# Patient Record
Sex: Female | Born: 1980 | Race: White | Hispanic: No | Marital: Single | Smoking: Current every day smoker
Health system: Southern US, Community
[De-identification: ages and names within clinical notes are randomized; demographics above are authoritative.]

## PROBLEM LIST (undated history)

## (undated) DIAGNOSIS — A6009 Herpesviral infection of other urogenital tract: Secondary | ICD-10-CM

## (undated) DIAGNOSIS — O98319 Other infections with a predominantly sexual mode of transmission complicating pregnancy, unspecified trimester: Secondary | ICD-10-CM

## (undated) HISTORY — DX: Herpesviral infection of other urogenital tract: A60.09

## (undated) HISTORY — PX: TONSILLECTOMY: SUR1361

## (undated) HISTORY — DX: Other infections with a predominantly sexual mode of transmission complicating pregnancy, unspecified trimester: O98.319

---

## 2016-03-16 NOTE — L&D Delivery Note (Signed)
Patient is a 36 y.o. now [id number] who admitted for SOL, now s/p NSVD at 7950w5d  Delivery Note At 11:03 AM a viable female was delivered via Vaginal, Spontaneous Delivery (Presentation: ;  ).  APGAR: 9, 9; weight 7 lb 9.5 oz (3445 g).   Placenta status: intact   Cord:  3-vessel  Anesthesia: Epidural Episiotomy: None Lacerations: 2nd degree; right Labial Suture Repair: 3.0 vicryl Est. Blood Loss (mL): 1000ml  Head delivered LOA. No nuchal cord present. Shoulder and body delivered in usual fashion. Infant to mother's abdomen. Cord clamped x 2 after 1-minute delay, and cut by family member. Cord blood drawn. Placenta delivered spontaneously with gentle cord traction. Fundus firm with massage and Pitocin. Perineum inspected and found to have 2nd degree laceration, which was found to be hemostatic which was repaired with 3.0 Vyrcil by Dr. Macon LargeAnyanwu with good hemostasis achieved.  Initial EBL 300ml, but later called into the room for bleeding. Dr. Macon LargeAnyanwu evaluated and patient found to have lower uterine segment atony. She was given methergine and cytotec. Total EBL 1000ml.   Mom to postpartum.  Baby to Couplet care / Skin to Skin.   Raynelle FanningJulie P. Degele, MD OB Fellow 10/16/16, 12:36 PM

## 2016-04-16 DIAGNOSIS — Z3480 Encounter for supervision of other normal pregnancy, unspecified trimester: Secondary | ICD-10-CM | POA: Insufficient documentation

## 2016-04-17 ENCOUNTER — Ambulatory Visit (INDEPENDENT_AMBULATORY_CARE_PROVIDER_SITE_OTHER): Payer: Medicaid Other | Admitting: Advanced Practice Midwife

## 2016-04-17 ENCOUNTER — Encounter: Payer: Self-pay | Admitting: Advanced Practice Midwife

## 2016-04-17 ENCOUNTER — Other Ambulatory Visit (HOSPITAL_COMMUNITY)
Admission: RE | Admit: 2016-04-17 | Discharge: 2016-04-17 | Disposition: A | Discharge status: Home / Self Care | Payer: Medicaid Other | Source: Ambulatory Visit | Attending: Advanced Practice Midwife | Admitting: Advanced Practice Midwife

## 2016-04-17 VITALS — BP 100/60 | HR 79 | Temp 98.7°F | Ht 64.0 in | Wt 125.8 lb

## 2016-04-17 DIAGNOSIS — O98319 Other infections with a predominantly sexual mode of transmission complicating pregnancy, unspecified trimester: Secondary | ICD-10-CM

## 2016-04-17 DIAGNOSIS — R894 Abnormal immunological findings in specimens from other organs, systems and tissues: Secondary | ICD-10-CM | POA: Insufficient documentation

## 2016-04-17 DIAGNOSIS — Z3490 Encounter for supervision of normal pregnancy, unspecified, unspecified trimester: Secondary | ICD-10-CM

## 2016-04-17 DIAGNOSIS — O98311 Other infections with a predominantly sexual mode of transmission complicating pregnancy, first trimester: Secondary | ICD-10-CM

## 2016-04-17 DIAGNOSIS — O26891 Other specified pregnancy related conditions, first trimester: Secondary | ICD-10-CM

## 2016-04-17 DIAGNOSIS — Z3A12 12 weeks gestation of pregnancy: Secondary | ICD-10-CM | POA: Insufficient documentation

## 2016-04-17 DIAGNOSIS — N898 Other specified noninflammatory disorders of vagina: Secondary | ICD-10-CM | POA: Diagnosis not present

## 2016-04-17 DIAGNOSIS — A6009 Herpesviral infection of other urogenital tract: Secondary | ICD-10-CM

## 2016-04-17 DIAGNOSIS — A609 Anogenital herpesviral infection, unspecified: Secondary | ICD-10-CM

## 2016-04-17 DIAGNOSIS — Z8619 Personal history of other infectious and parasitic diseases: Secondary | ICD-10-CM

## 2016-04-17 MED ORDER — VALACYCLOVIR HCL 500 MG PO TABS: 500.0000 mg | ORAL_TABLET | Freq: Two times a day (BID) | ORAL | 3 refills | Status: DC

## 2016-04-17 NOTE — Patient Instructions (Addendum)
Prenatal Care WHAT IS PRENATAL CARE? Prenatal care is the process of caring for a pregnant woman before she gives birth. Prenatal care makes sure that she and her baby remain as healthy as possible throughout pregnancy. Prenatal care may be provided by a midwife, family practice health care provider, or a childbirth and pregnancy specialist (obstetrician). Prenatal care may include physical examinations, testing, treatments, and education on nutrition, lifestyle, and social support services. WHY IS PRENATAL CARE SO IMPORTANT? Early and consistent prenatal care increases the chance that you and your baby will remain healthy throughout your pregnancy. This type of care also decreases a baby's risk of being born too early (prematurely), or being born smaller than expected (small for gestational age). Any underlying medical conditions you may have that could pose a risk during your pregnancy are discussed during prenatal care visits. You will also be monitored regularly for any new conditions that may arise during your pregnancy so they can be treated quickly and effectively. WHAT HAPPENS DURING PRENATAL CARE VISITS? Prenatal care visits may include the following: Discussion Tell your health care provider about any new signs or symptoms you have experienced since your last visit. These might include:  Nausea or vomiting.  Increased or decreased level of energy.  Difficulty sleeping.  Back or leg pain.  Weight changes.  Frequent urination.  Shortness of breath with physical activity.  Changes in your skin, such as the development of a rash or itchiness.  Vaginal discharge or bleeding.  Feelings of excitement or nervousness.  Changes in your baby's movements. You may want to write down any questions or topics you want to discuss with your health care provider and bring them with you to your appointment. Examination During your first prenatal care visit, you will likely have a complete  physical exam. Your health care provider will often examine your vagina, cervix, and the position of your uterus, as well as check your heart, lungs, and other body systems. As your pregnancy progresses, your health care provider will measure the size of your uterus and your baby's position inside your uterus. He or she may also examine you for early signs of labor. Your prenatal visits may also include checking your blood pressure and, after about 10-12 weeks of pregnancy, listening to your baby's heartbeat. Testing Regular testing often includes:  Urinalysis. This checks your urine for glucose, protein, or signs of infection.  Blood count. This checks the levels of white and red blood cells in your body.  Tests for sexually transmitted infections (STIs). Testing for STIs at the beginning of pregnancy is routinely done and is required in many states.  Antibody testing. You will be checked to see if you are immune to certain illnesses, such as rubella, that can affect a developing fetus.  Glucose screen. Around 24-28 weeks of pregnancy, your blood glucose level will be checked for signs of gestational diabetes. Follow-up tests may be recommended.  Group B strep. This is a bacteria that is commonly found inside a woman's vagina. This test will inform your health care provider if you need an antibiotic to reduce the amount of this bacteria in your body prior to labor and childbirth.  Ultrasound. Many pregnant women undergo an ultrasound screening around 18-20 weeks of pregnancy to evaluate the health of the fetus and check for any developmental abnormalities.  HIV (human immunodeficiency virus) testing. Early in your pregnancy, you will be screened for HIV. If you are at high risk for HIV, this test may be  repeated during your third trimester of pregnancy. You may be offered other testing based on your age, personal or family medical history, or other factors. HOW OFTEN SHOULD I PLAN TO SEE MY  HEALTH CARE PROVIDER FOR PRENATAL CARE? Your prenatal care check-up schedule depends on any medical conditions you have before, or develop during, your pregnancy. If you do not have any underlying medical conditions, you will likely be seen for checkups:  Monthly, during the first 6 months of pregnancy.  Twice a month during months 7 and 8 of pregnancy.  Weekly starting in the 9th month of pregnancy and until delivery. If you develop signs of early labor or other concerning signs or symptoms, you may need to see your health care provider more often. Ask your health care provider what prenatal care schedule is best for you. WHAT CAN I DO TO KEEP MYSELF AND MY BABY AS HEALTHY AS POSSIBLE DURING MY PREGNANCY?  Take a prenatal vitamin containing 400 micrograms (0.4 mg) of folic acid every day. Your health care provider may also ask you to take additional vitamins such as iodine, vitamin D, iron, copper, and zinc.  Take 1500-2000 mg of calcium daily starting at your 20th week of pregnancy until you deliver your baby.  Make sure you are up to date on your vaccinations. Unless directed otherwise by your health care provider:  You should receive a tetanus, diphtheria, and pertussis (Tdap) vaccination between the 27th and 36th week of your pregnancy, regardless of when your last Tdap immunization occurred. This helps protect your baby from whooping cough (pertussis) after he or she is born.  You should receive an annual inactivated influenza vaccine (IIV) to help protect you and your baby from influenza. This can be done at any point during your pregnancy.  Eat a well-rounded diet that includes:  Fresh fruits and vegetables.  Lean proteins.  Calcium-rich foods such as milk, yogurt, hard cheeses, and dark, leafy greens.  Whole grain breads.  Do noteat seafood high in mercury, including:  Swordfish.  Tilefish.  Shark.  King mackerel.  More than 6 oz tuna per week.  Do not eat:  Raw  or undercooked meats or eggs.  Unpasteurized foods, such as soft cheeses (brie, blue, or feta), juices, and milks.  Lunch meats.  Hot dogs that have not been heated until they are steaming.  Drink enough water to keep your urine clear or pale yellow. For many women, this may be 10 or more 8 oz glasses of water each day. Keeping yourself hydrated helps deliver nutrients to your baby and may prevent the start of pre-term uterine contractions.  Do not use any tobacco products including cigarettes, chewing tobacco, or electronic cigarettes. If you need help quitting, ask your health care provider.  Do not drink beverages containing alcohol. No safe level of alcohol consumption during pregnancy has been determined.  Do not use any illegal drugs. These can harm your developing baby or cause a miscarriage.  Ask your health care provider or pharmacist before taking any prescription or over-the-counter medicines, herbs, or supplements.  Limit your caffeine intake to no more than 200 mg per day.  Exercise. Unless told otherwise by your health care provider, try to get 30 minutes of moderate exercise most days of the week. Do not  do high-impact activities, contact sports, or activities with a high risk of falling, such as horseback riding or downhill skiing.  Get plenty of rest.  Avoid anything that raises your body temperature, such  as hot tubs and saunas.  If you own a cat, do not empty its litter box. Bacteria contained in cat feces can cause an infection called toxoplasmosis. This can result in serious harm to the fetus.  Stay away from chemicals such as insecticides, lead, mercury, and cleaning or paint products that contain solvents.  Do not have any X-rays taken unless medically necessary.  Take a childbirth and breastfeeding preparation class. Ask your health care provider if you need a referral or recommendation. This information is not intended to replace advice given to you by your  health care provider. Make sure you discuss any questions you have with your health care provider. Document Released: 03/05/2003 Document Revised: 08/05/2015 Document Reviewed: 05/17/2013 Elsevier Interactive Patient Education  2017 Else[employer] Inc.   Pregnancy and Influenza Influenza, also called the flu, is an infection of the respiratory tract. If you are pregnant, you are more likely to catch the flu. You are also more likely to have a more serious case of the flu. This is because pregnancy lowers your body's ability to fight off infections (it weakens your immune system). It also puts additional stress on your heart and lungs, which makes you more likely to have complications. Having a bad case of the flu, especially with a high fever, can be dangerous for your developing baby. It can cause you to go into early labor. How do people get the flu? The flu is caused by the influenza virus. This virus is common every year in the fall and winter. It spreads when virus particles get passed from person to person. You can get the virus if you are near a sick person who is coughing or sneezing. You can also get the virus if you touch something that has the virus on it and then touch your face. How can I protect myself against the flu?  Get a flu shot. The best way to prevent the flu is to get a flu shot before flu season starts. The flu shot is not dangerous for your developing baby. It may even help protect your baby from the flu for up to 6 months after birth. The flu shot is one type of flu vaccine. Another type is a nasal spray vaccine. Do not get the nasal spray vaccine. It is not approved for pregnancy.  Do not come in close contact with sick people.  Do not share food, drinks, or utensils with other people.  Wash your hands often. Use hand sanitizer when soap and water are not available. What should I do if I have flu symptoms? If you have any flu symptoms, call your health care provider right  away. Flu symptoms include:  Fever or chills.  Muscle aches.  Headache.  Sore throat.  Nasal congestion.  Cough.  Feeling tired.  Loss of appetite.  Vomiting.  Diarrhea. You may be able to take an antiviral medicine to keep the flu from becoming severe and to shorten how long it lasts. What should I do at home if I am diagnosed with the flu?  Do not take any medicine, including cold or flu medicine, unless directed by your health care provider.  If you take antiviral medicine, make sure you finish it even if you start to feel better.  Drink enough fluid to keep your urine clear or pale yellow.  Get plenty of rest. When would I seek immediate medical care if I have the flu?  You have trouble breathing.  You have chest pain.  You begin to have labor pains.  You have a high fever that does not go down after you take medicine.  You do not feel your baby move.  You have diarrhea or vomiting that will not go away. This information is not intended to replace advice given to you by your health care provider. Make sure you discuss any questions you have with your health care provider. Document Released: 01/03/2008 Document Revised: 08/08/2015 Document Reviewed: 01/27/2013 Elsevier Interactive Patient Education  2017 Else[employer] Inc.   Safe Medications in Pregnancy   Acne: Benzoyl Peroxide Salicylic Acid  Backache/Headache: Tylenol: 2 regular strength every 4 hours OR              2 Extra strength every 6 hours  Colds/Coughs/Allergies: Benadryl (alcohol free) 25 mg every 6 hours as needed Breath right strips Claritin Cepacol throat lozenges Chloraseptic throat spray Cold-Eeze- up to three times per day Cough drops, alcohol free Flonase (by prescription only) Guaifenesin Mucinex Robitussin DM (plain only, alcohol free) Saline nasal spray/drops Sudafed (pseudoephedrine) & Actifed ** use only after [redacted] weeks gestation and if you do not have high blood  pressure Tylenol Vicks Vaporub Zinc lozenges Zyrtec   Constipation: Colace Ducolax suppositories Fleet enema Glycerin suppositories Metamucil Milk of magnesia Miralax Senokot Smooth move tea  Diarrhea: Kaopectate Imodium A-D  *NO pepto Bismol  Hemorrhoids: Anusol Anusol HC Preparation H Tucks  Indigestion: Tums Maalox Mylanta Zantac  Pepcid  Insomnia: Benadryl (alcohol free) 25mg  every 6 hours as needed Tylenol PM Unisom, no Gelcaps  Leg Cramps: Tums MagGel  Nausea/Vomiting:  Bonine Dramamine Emetrol Ginger extract Sea bands Meclizine  Nausea medication to take during pregnancy:  Unisom (doxylamine succinate 25 mg tablets) Take one tablet daily at bedtime. If symptoms are not adequately controlled, the dose can be increased to a maximum recommended dose of two tablets daily (1/2 tablet in the morning, 1/2 tablet mid-afternoon and one at bedtime). Vitamin B6 100mg  tablets. Take one tablet twice a day (up to 200 mg per day).  Skin Rashes: Aveeno products Benadryl cream or 25mg  every 6 hours as needed Calamine Lotion 1% cortisone cream  Yeast infection: Gyne-lotrimin 7 Monistat 7   **If taking multiple medications, please check labels to avoid duplicating the same active ingredients **take medication as directed on the label ** Do not exceed 4000 mg of tylenol in 24 hours **Do not take medications that contain aspirin or ibuprofen

## 2016-04-17 NOTE — Progress Notes (Signed)
  Subjective:    Krystal Barajas is being seen today for her first obstetrical visit. She is at 4146w5d gestation by certain regular LMP and US today. Her obstetrical history is significant for nothing. Hx Genital Herpes. . Patient does intend to breast feed. Pregnancy history fully reviewed. [facility]. Hx HPV w/ normal Paps since. Never had LEEP, Cryo.   Patient reports no complaints.  Review of Systems:   Review of Systems  Constitutional: Negative for appetite change, chills and fever.  Gastrointestinal: Negative for abdominal pain, nausea and vomiting.  Genitourinary: Positive for vaginal discharge. Negative for dysuria and vaginal bleeding.    Objective:     BP 100/60   Pulse 79   Temp 98.7 F (37.1 C)   Ht 5\' 4"  (1.626 m)   Wt 125 lb 12.8 oz (57.1 kg)   LMP 01/19/2016 (Approximate)   BMI 21.59 kg/m  Physical Exam  Constitutional: She is oriented to person, place, and time. She appears well-developed and well-nourished. No distress.  HENT:  Mouth/Throat: Oropharynx is clear and moist.  Eyes: Conjunctivae are normal. No scleral icterus.  Cardiovascular: Normal rate.   Respiratory: Effort normal. No respiratory distress.  GI: Soft. There is no tenderness.  Genitourinary: Vagina normal. There is no lesion on the right labia. There is no lesion on the left labia. No bleeding in the vagina. No vaginal discharge found.  Musculoskeletal: She exhibits no edema or tenderness.  Neurological: She is alert and oriented to person, place, and time. She has normal reflexes.  Skin: Skin is warm and dry. No erythema.  Psychiatric: She has a normal mood and affect.    Maternal Exam:  Abdomen: Fundal height is 13.    Introitus: Normal vulva. Normal vagina.  Vagina is negative for discharge.    Fetal Exam Fetal Monitor Review: Mode: ultrasound.   Baseline rate: 152.     Informal BS US shows CRL 7.15 corresponding w/ ~13 weeks. Pos FHR  and fetal mvmt.     Assessment:   1. Encounter for supervision of normal pregnancy, antepartum, unspecified gravidity  - Obstetric Panel, Including HIV - Culture, OB Urine - GC/Chlamydia probe amp ([place] - ToxASSURE Select 13 (MW), Urine - Varicella zoster antibody, IgG - Hemoglobinopathy evaluation - dicyclomine (BENTYL) 20 MG tablet; Take 20 mg by mouth every 6 (six) hours. - Prenatal Vit-Fe Fumarate-FA (MULTIVITAMIN-PRENATAL) 27-0.8 MG TABS tablet; Take 1 tablet by mouth daily at 12 noon. - valACYclovir (VALTREX) 500 MG tablet; Take 1 tablet (500 mg total) by mouth 2 (two) times daily. Start at 36 weeks for herpes prophylaxis.  Dispense: 30 tablet; Refill: 3 - Cervicovaginal ancillary only  2. Genital herpes affecting pregnancy, antepartum (Not discussed due to mother in room.)  - Plan prophylaxis at 36 weeks. ValACYclovir (VALTREX) 500 MG tablet; Take 1 tablet (500 mg total) by mouth 2 (two) times daily. Start at 36 weeks for herpes prophylaxis.  Dispense: 30 tablet; Refill: 3  3. Vaginal discharge during pregnancy in first trimester  - Cervicovaginal ancillary only  4. History of HPV infection - Requested Pap records from Gynecologist       Plan:     Initial labs drawn. Prenatal vitamins. Problem list reviewed and updated. AFP3 discussed: declined. Role of ultrasound in pregnancy discussed; fetal survey: requested. Amniocentesis discussed: not indicated. Follow up in 4 weeks. Declined Flu vaccine. Info given.   Krystal KinsmanVirginia Bera Barajas 04/17/2016

## 2016-04-19 LAB — CULTURE, OB URINE

## 2016-04-19 LAB — URINE CULTURE, OB REFLEX: Organism ID, Bacteria: NO GROWTH

## 2016-04-21 LAB — CERVICOVAGINAL ANCILLARY ONLY
BACTERIAL VAGINITIS: POSITIVE — AB
Candida vaginitis: NEGATIVE
Chlamydia: NEGATIVE
NEISSERIA GONORRHEA: NEGATIVE
TRICH (WINDOWPATH): NEGATIVE

## 2016-04-22 ENCOUNTER — Other Ambulatory Visit: Payer: Self-pay | Admitting: Advanced Practice Midwife

## 2016-04-22 ENCOUNTER — Encounter: Payer: Self-pay | Admitting: *Deleted

## 2016-04-22 DIAGNOSIS — B9689 Other specified bacterial agents as the cause of diseases classified elsewhere: Secondary | ICD-10-CM

## 2016-04-22 DIAGNOSIS — N760 Acute vaginitis: Principal | ICD-10-CM

## 2016-04-22 LAB — HEMOGLOBINOPATHY EVALUATION
HGB C: 0 %
HGB S: 0 %
HGB VARIANT: 0 %
Hemoglobin A2 Quantitation: 2.7 % (ref 1.8–3.2)
Hemoglobin F Quantitation: 0 % (ref 0.0–2.0)
Hgb A: 97.3 % (ref 96.4–98.8)

## 2016-04-22 LAB — OBSTETRIC PANEL, INCLUDING HIV
ANTIBODY SCREEN: NEGATIVE
BASOS ABS: 0 10*3/uL (ref 0.0–0.2)
BASOS: 0 %
EOS (ABSOLUTE): 0.1 10*3/uL (ref 0.0–0.4)
Eos: 1 %
HIV Screen 4th Generation wRfx: NONREACTIVE
Hematocrit: 36.1 % (ref 34.0–46.6)
Hemoglobin: 12.7 g/dL (ref 11.1–15.9)
Hepatitis B Surface Ag: NEGATIVE
IMMATURE GRANS (ABS): 0 10*3/uL (ref 0.0–0.1)
IMMATURE GRANULOCYTES: 0 %
LYMPHS: 27 %
Lymphocytes Absolute: 2.8 10*3/uL (ref 0.7–3.1)
MCH: 31.4 pg (ref 26.6–33.0)
MCHC: 35.2 g/dL (ref 31.5–35.7)
MCV: 89 fL (ref 79–97)
MONOS ABS: 0.6 10*3/uL (ref 0.1–0.9)
Monocytes: 6 %
NEUTROS PCT: 66 %
Neutrophils Absolute: 6.8 10*3/uL (ref 1.4–7.0)
PLATELETS: 259 10*3/uL (ref 150–379)
RBC: 4.05 x10E6/uL (ref 3.77–5.28)
RDW: 13.1 % (ref 12.3–15.4)
RPR Ser Ql: NONREACTIVE
Rh Factor: POSITIVE
Rubella Antibodies, IGG: 1.02 index (ref >0.99)
WBC: 10.3 10*3/uL (ref 3.4–10.8)

## 2016-04-22 LAB — VARICELLA ZOSTER ANTIBODY, IGG: VARICELLA: 1209 {index} (ref >165)

## 2016-04-22 MED ORDER — METRONIDAZOLE 500 MG PO TABS: 500.0000 mg | ORAL_TABLET | Freq: Two times a day (BID) | ORAL | 0 refills | Status: DC

## 2016-04-22 NOTE — Progress Notes (Signed)
Dx BV. Rx Flagyl.   Pt aware BV test results and Flagyl was sent to the pharmacy.

## 2016-04-24 ENCOUNTER — Encounter: Payer: Self-pay | Admitting: Advanced Practice Midwife

## 2016-04-24 DIAGNOSIS — F1190 Opioid use, unspecified, uncomplicated: Secondary | ICD-10-CM | POA: Insufficient documentation

## 2016-04-24 LAB — TOXASSURE SELECT 13 (MW), URINE

## 2016-05-15 ENCOUNTER — Encounter: Payer: Self-pay | Admitting: Obstetrics

## 2016-05-15 ENCOUNTER — Ambulatory Visit (INDEPENDENT_AMBULATORY_CARE_PROVIDER_SITE_OTHER): Payer: Medicaid Other | Admitting: Obstetrics

## 2016-05-15 VITALS — BP 110/98 | HR 98 | Wt 130.0 lb

## 2016-05-15 DIAGNOSIS — Z3480 Encounter for supervision of other normal pregnancy, unspecified trimester: Secondary | ICD-10-CM

## 2016-05-15 DIAGNOSIS — Z3482 Encounter for supervision of other normal pregnancy, second trimester: Secondary | ICD-10-CM

## 2016-05-15 DIAGNOSIS — Z3490 Encounter for supervision of normal pregnancy, unspecified, unspecified trimester: Secondary | ICD-10-CM

## 2016-05-15 NOTE — Progress Notes (Signed)
Subjective:  Krystal Barajas Schexnayder is a 36 y.o. G3P2002 at 4138w5d being seen today for ongoing prenatal care.  She is currently monitored for the following issues for this low-risk pregnancy and has Supervision of normal pregnancy, antepartum; Genital herpes affecting pregnancy, antepartum; History of HPV infection; and Opiate use on her problem list.  Patient reports heartburn and gas.  Contractions: Not present. Vag. Bleeding: None.  Movement: Present. Denies leaking of fluid.   The following portions of the patient's history were reviewed and updated as appropriate: allergies, current medications, past family history, past medical history, past social history, past surgical history and problem list. Problem list updated.  Objective:   Vitals:   05/15/16 0941  BP: (!) 110/98  Pulse: 98  Weight: 130 lb (59 kg)    Fetal Status: Fetal Heart Rate (bpm): 150   Movement: Present     General:  Alert, oriented and cooperative. Patient is in no acute distress.  Skin: Skin is warm and dry. No rash noted.   Cardiovascular: Normal heart rate noted  Respiratory: Normal respiratory effort, no problems with respiration noted  Abdomen: Soft, gravid, appropriate for gestational age. Pain/Pressure: Absent     Pelvic:  Cervical exam deferred        Extremities: Normal range of motion.  Edema: Trace  Mental Status: Normal mood and affect. Normal behavior. Normal judgment and thought content.   Urinalysis:      Assessment and Plan:  Pregnancy: G3P2002 at 3438w5d  1. Encounter for supervision of normal pregnancy, antepartum, unspecified gravidity Rx: - US MFM OB COMP + 14 WK; Future  Preterm labor symptoms and general obstetric precautions including but not limited to vaginal bleeding, contractions, leaking of fluid and fetal movement were reviewed in detail with the patient. Please refer to After Visit Summary for other counseling recommendations.  No Follow-up on file.   Brock Badharles A Alex Leahy, MDPatient ID:  Krystal Barajas Crist, female   DOB: 04/24/1980, 36 y.o.   MRN: [id number]

## 2016-05-15 NOTE — Progress Notes (Signed)
Pt c/o bloating and flatus. Hx of IBS.

## 2016-06-02 ENCOUNTER — Ambulatory Visit (HOSPITAL_COMMUNITY): Payer: Medicaid Other

## 2016-06-04 ENCOUNTER — Other Ambulatory Visit: Payer: Self-pay | Admitting: Obstetrics

## 2016-06-04 ENCOUNTER — Ambulatory Visit (HOSPITAL_COMMUNITY)
Admission: RE | Admit: 2016-06-04 | Discharge: 2016-06-04 | Disposition: A | Discharge status: Home / Self Care | Payer: Medicaid Other | Source: Ambulatory Visit | Attending: Obstetrics | Admitting: Obstetrics

## 2016-06-04 DIAGNOSIS — Z3490 Encounter for supervision of normal pregnancy, unspecified, unspecified trimester: Secondary | ICD-10-CM

## 2016-06-04 DIAGNOSIS — Z3A19 19 weeks gestation of pregnancy: Secondary | ICD-10-CM

## 2016-06-04 DIAGNOSIS — Z3689 Encounter for other specified antenatal screening: Secondary | ICD-10-CM

## 2016-06-04 DIAGNOSIS — Z363 Encounter for antenatal screening for malformations: Secondary | ICD-10-CM | POA: Diagnosis present

## 2016-06-11 ENCOUNTER — Encounter: Payer: Self-pay | Admitting: Obstetrics & Gynecology

## 2016-06-11 ENCOUNTER — Ambulatory Visit (INDEPENDENT_AMBULATORY_CARE_PROVIDER_SITE_OTHER): Payer: Medicaid Other | Admitting: Obstetrics & Gynecology

## 2016-06-11 DIAGNOSIS — R894 Abnormal immunological findings in specimens from other organs, systems and tissues: Secondary | ICD-10-CM

## 2016-06-11 DIAGNOSIS — Z3482 Encounter for supervision of other normal pregnancy, second trimester: Secondary | ICD-10-CM

## 2016-06-11 DIAGNOSIS — Z3490 Encounter for supervision of normal pregnancy, unspecified, unspecified trimester: Secondary | ICD-10-CM

## 2016-06-11 MED ORDER — LORATADINE 10 MG PO TABS: 10.0000 mg | ORAL_TABLET | Freq: Every day | ORAL | 1 refills | Status: DC

## 2016-06-11 NOTE — Patient Instructions (Signed)
Pregnancy and Genital Herpes Genital herpes is an STD (sexually transmitted disease) that is caused by a virus. An active infection can cause itching, blisters, and sores (lesions) in the genital area or rectal area. This is called an outbreak. Symptoms of genital herpes may last several days and then go away. However, the virus remains in your body, so you may have more outbreaks of symptoms in the future (recurrent infection). Genital herpes is particularly concerning for pregnant women because the virus can be passed to the unborn or newborn baby and cause serious problems. When can the herpes virus be passed to my baby? The virus can be passed to your baby:  Before delivery. The virus can be passed to your unborn baby through the placenta. This is more likely to occur if you get herpes for the first time (primary infection) in the first 3 months of your pregnancy (first trimester). This can possibly cause a miscarriage or birth defects in the baby.  During delivery. This is more likely to occur if you become infected for the first time late in your pregnancy. The virus is less likely to pass to your baby if you had herpes before you became pregnant. This is because antibodies against the virus develop over a period of time. These antibodies help to protect the baby.  After delivery. As a newborn, your baby can get a herpes infection if you touch active lesions and then touch your baby without washing your hands. Can I take medicines for herpes during pregnancy? Medicines may be prescribed that are safe for a mother and her unborn baby. The medicine can help to reduce symptoms or prevent another outbreak of the infection. If the infection happened before you became pregnant, medicine may be prescribed in the last 4 weeks of the pregnancy. This can help to prevent a breakout of the infection at the time of delivery. Will herpes affect my delivery? Your baby will likely need to be delivered by  cesarean delivery ("C-section") if:  You have an active, recurrent, or new herpes outbreak at the time of delivery. This is because the virus can pass to the baby through an infected birth canal. This can cause severe problems for the baby.  You have any signs or symptoms of the infection being present in the genital area, even if you do not have any visible lesions in the birth canal. Other symptoms may include genital pain, burning, and itching.  You develop the infection for the first time late in your pregnancy. Your health care provider may recommend a cesarean delivery because your body has not had the time to build up enough antibodies against the virus to protect the baby from getting the infection. You will likely be able to have a vaginal delivery if you had a first-time herpes infection before your third trimester and you have no evidence of an outbreak when you go into labor. Can I breastfeed my baby? A woman who is infected with genital herpes can breastfeed her baby. The virus will not be present in the breast milk. If lesions are present on a breast, the baby should not breastfeed from the affected breast. How can I avoid passing herpes to my baby after delivery? Take these actions that can help you to avoid passing the virus to your baby:  Wash your hands with soap and water often and before touching your baby.  If you have an outbreak, keep the area clean and covered.  Try to avoid physical and  stressful situations that may bring on an outbreak. When should I seek medical care? Seek medical care if you have signs or symptoms of a herpes outbreak at any time during your pregnancy. These signs or symptoms may include:  A rash, blisters, lesions, or ulcers in your genital area or rectal area.  Burning, itching, or pain in your genital area or rectal area.  Trouble urinating. This information is not intended to replace advice given to you by your health care provider. Make sure  you discuss any questions you have with your health care provider. Document Released: 06/08/2000 Document Revised: 08/05/2015 Document Reviewed: 08/20/2014 Elsevier Interactive Patient Education  2017 [employer]

## 2016-06-11 NOTE — Progress Notes (Signed)
US normal 1 week ago confirms dates Discussed her Valtrex which was prescribed by family medicine over one year ago for positive HSV antibodies, no h/o outbreak    PRENATAL VISIT NOTE  Subjective:  Krystal Barajas is a 36 y.o. G3P2002 at 271w4d being seen today for ongoing prenatal care.  She is currently monitored for the following issues for this low-risk pregnancy and has Supervision of normal pregnancy, antepartum; Herpes simplex antibody positive; History of HPV infection; and Opiate use on her problem list.  Patient reports no complaints.  Contractions: Not present. Vag. Bleeding: None.  Movement: Present. Denies leaking of fluid.   The following portions of the patient's history were reviewed and updated as appropriate: allergies, current medications, past family history, past medical history, past social history, past surgical history and problem list. Problem list updated.  Objective:   Vitals:   06/11/16 1105  BP: 109/71  Pulse: (!) 102  Weight: 132 lb (59.9 kg)    Fetal Status:     Movement: Present     General:  Alert, oriented and cooperative. Patient is in no acute distress.  Skin: Skin is warm and dry. No rash noted.   Cardiovascular: Normal heart rate noted  Respiratory: Normal respiratory effort, no problems with respiration noted  Abdomen: Soft, gravid, appropriate for gestational age. Pain/Pressure: Absent     Pelvic:  Cervical exam deferred        Extremities: Normal range of motion.  Edema: Trace  Mental Status: Normal mood and affect. Normal behavior. Normal judgment and thought content.   Assessment and Plan:  Pregnancy: G3P2002 at 6471w4d  1. Encounter for supervision of normal pregnancy, antepartum, unspecified gravidity Watery eyes possible allergies - loratadine (CLARITIN) 10 MG tablet; Take 1 tablet (10 mg total) by mouth daily.  Dispense: 30 tablet; Refill: 1  2. Herpes simplex antibody positive Need record of her testing and advised she could stop  this medication for now  Preterm labor symptoms and general obstetric precautions including but not limited to vaginal bleeding, contractions, leaking of fluid and fetal movement were reviewed in detail with the patient. Please refer to After Visit Summary for other counseling recommendations.  Return in about 4 weeks (around 07/09/2016).   Adam PhenixJames G Masato Pettie, MD

## 2016-07-09 ENCOUNTER — Ambulatory Visit (INDEPENDENT_AMBULATORY_CARE_PROVIDER_SITE_OTHER): Payer: Medicaid Other | Admitting: Obstetrics and Gynecology

## 2016-07-09 ENCOUNTER — Encounter: Payer: Medicaid Other | Admitting: Certified Nurse Midwife

## 2016-07-09 VITALS — BP 94/60 | HR 87 | Temp 98.1°F | Wt 139.1 lb

## 2016-07-09 DIAGNOSIS — Z3490 Encounter for supervision of normal pregnancy, unspecified, unspecified trimester: Secondary | ICD-10-CM

## 2016-07-09 DIAGNOSIS — R894 Abnormal immunological findings in specimens from other organs, systems and tissues: Secondary | ICD-10-CM

## 2016-07-09 DIAGNOSIS — Z3009 Encounter for other general counseling and advice on contraception: Secondary | ICD-10-CM | POA: Insufficient documentation

## 2016-07-09 NOTE — Patient Instructions (Signed)

## 2016-07-09 NOTE — Progress Notes (Signed)
Subjective:  Krystal Barajas is a 36 y.o. G3P2002 at [redacted]w[redacted]d being seen today for ongoing prenatal care.  She is currently monitored for the following issues for this low-risk pregnancy and has Supervision of normal pregnancy, antepartum; Herpes simplex antibody positive; History of HPV infection; Opiate use; and Unwanted fertility on her problem list.  Patient reports no complaints.  Contractions: Not present. Vag. Bleeding: None.  Movement: Present. Denies leaking of fluid.   The following portions of the patient's history were reviewed and updated as appropriate: allergies, current medications, past family history, past medical history, past social history, past surgical history and problem list. Problem list updated.  Objective:   Vitals:   07/09/16 1053  BP: 94/60  Pulse: 87  Temp: 98.1 F (36.7 C)  Weight: 139 lb 1.6 oz (63.1 kg)    Fetal Status: Fetal Heart Rate (bpm): 155   Movement: Present     General:  Alert, oriented and cooperative. Patient is in no acute distress.  Skin: Skin is warm and dry. No rash noted.   Cardiovascular: Normal heart rate noted  Respiratory: Normal respiratory effort, no problems with respiration noted  Abdomen: Soft, gravid, appropriate for gestational age. Pain/Pressure: Absent     Pelvic:  Cervical exam deferred        Extremities: Normal range of motion.  Edema: None  Mental Status: Normal mood and affect. Normal behavior. Normal judgment and thought content.   Urinalysis:      Assessment and Plan:  Pregnancy: G3P2002 at [redacted]w[redacted]d  1. Encounter for supervision of normal pregnancy, antepartum, unspecified gravidity Stable 28 week labs next visit 2. Herpes simplex antibody positive Prophylactic at 36 weeks  3. Unwanted fertility Sign consent at next visit  Preterm labor symptoms and general obstetric precautions including but not limited to vaginal bleeding, contractions, leaking of fluid and fetal movement were reviewed in detail with the  patient. Please refer to After Visit Summary for other counseling recommendations.  Return in about 4 weeks (around 08/06/2016) for OB visit.   Hermina Staggers, MD

## 2016-07-09 NOTE — Progress Notes (Signed)
Patient reports good fetal movement, denies pain but complains of cough with phlegm, denies sore throat.

## 2016-08-06 ENCOUNTER — Encounter: Payer: Medicaid Other | Admitting: Obstetrics & Gynecology

## 2016-08-06 ENCOUNTER — Other Ambulatory Visit: Payer: Medicaid Other

## 2016-08-06 ENCOUNTER — Telehealth: Payer: Self-pay

## 2016-08-06 NOTE — Telephone Encounter (Signed)
Called pt to reschedule appointment. No answer and no voicemail picked up.

## 2016-08-25 ENCOUNTER — Other Ambulatory Visit: Payer: Medicaid Other

## 2016-08-25 ENCOUNTER — Ambulatory Visit (INDEPENDENT_AMBULATORY_CARE_PROVIDER_SITE_OTHER): Payer: Medicaid Other | Admitting: Obstetrics & Gynecology

## 2016-08-25 VITALS — BP 129/76 | HR 106 | Wt 144.0 lb

## 2016-08-25 DIAGNOSIS — F1190 Opioid use, unspecified, uncomplicated: Secondary | ICD-10-CM

## 2016-08-25 DIAGNOSIS — Z3483 Encounter for supervision of other normal pregnancy, third trimester: Secondary | ICD-10-CM

## 2016-08-25 DIAGNOSIS — Z3009 Encounter for other general counseling and advice on contraception: Secondary | ICD-10-CM

## 2016-08-25 DIAGNOSIS — Z3490 Encounter for supervision of normal pregnancy, unspecified, unspecified trimester: Secondary | ICD-10-CM

## 2016-08-25 NOTE — Progress Notes (Signed)
   PRENATAL VISIT NOTE  Subjective:  Krystal Barajas is a [age] y.o. G3P2002 at 6456w2d being seen today for ongoing prenatal care.  She is currently monitored for the following issues for this low-risk pregnancy and has Supervision of normal pregnancy, antepartum; Herpes simplex antibody positive; History of HPV infection; Opiate use; and Unwanted fertility on her problem list.   Patient reports no complaints.  Contractions: Not present. Vag. Bleeding: None.  Movement: Present. Denies leaking of fluid.   The following portions of the patient's history were reviewed and updated as appropriate: allergies, current medications, past family history, past medical history, past social history, past surgical history and problem list. Problem list updated.  Objective:   Vitals:   08/25/16 0847  BP: 129/76  Pulse: (!) 106  Weight: 144 lb (65.3 kg)    Fetal Status: Fetal Heart Rate (bpm): 138   Movement: Present     General:  Alert, oriented and cooperative. Patient is in no acute distress.  Skin: Skin is warm and dry. No rash noted.   Cardiovascular: Normal heart rate noted  Respiratory: Normal respiratory effort, no problems with respiration noted  Abdomen: Soft, gravid, appropriate for gestational age. Pain/Pressure: Absent     Pelvic:  Cervical exam deferred        Extremities: Normal range of motion.  Edema: None  Mental Status: Normal mood and affect. Normal behavior. Normal judgment and thought content.   Assessment and Plan:  Pregnancy: G3P2002 at 1956w2d  1. Encounter for supervision of normal pregnancy, antepartum, unspecified gravidity  - HIV antibody - RPR - CBC - Glucose Tolerance, 2 Hours w/1 Hour  2. Unwanted fertility - Plan BTL postpartum  3. Opiate use   Preterm labor symptoms and general obstetric precautions including but not limited to vaginal bleeding, contractions, leaking of fluid and fetal movement were reviewed in detail with the patient. Please refer to After  Visit Summary for other counseling recommendations.  Return in about 2 weeks (around 09/08/2016).   Krystal BossierMyra C Everli Rother, MD

## 2016-08-25 NOTE — Progress Notes (Signed)
Pt presents for ROB and 2 gtt. Tdap offered; pt declined. BTL consent signed today.

## 2016-08-26 LAB — CBC
HEMATOCRIT: 34.7 % (ref 34.0–46.6)
Hemoglobin: 11.5 g/dL (ref 11.1–15.9)
MCH: 31.6 pg (ref 26.6–33.0)
MCHC: 33.1 g/dL (ref 31.5–35.7)
MCV: 95 fL (ref 79–97)
Platelets: 306 10*3/uL (ref 150–379)
RBC: 3.64 x10E6/uL — ABNORMAL LOW (ref 3.77–5.28)
RDW: 13.4 % (ref 12.3–15.4)
WBC: 15.8 10*3/uL — AB (ref 3.4–10.8)

## 2016-08-26 LAB — GLUCOSE TOLERANCE, 2 HOURS W/ 1HR
GLUCOSE, 2 HOUR: 124 mg/dL (ref 65–152)
Glucose, 1 hour: 162 mg/dL (ref 65–179)
Glucose, Fasting: 83 mg/dL (ref 65–91)

## 2016-08-26 LAB — RPR: RPR Ser Ql: NONREACTIVE

## 2016-08-26 LAB — HIV ANTIBODY (ROUTINE TESTING W REFLEX): HIV Screen 4th Generation wRfx: NONREACTIVE

## 2016-08-27 ENCOUNTER — Encounter: Payer: Self-pay | Admitting: Obstetrics & Gynecology

## 2016-08-27 DIAGNOSIS — O24419 Gestational diabetes mellitus in pregnancy, unspecified control: Secondary | ICD-10-CM | POA: Insufficient documentation

## 2016-09-08 ENCOUNTER — Ambulatory Visit (INDEPENDENT_AMBULATORY_CARE_PROVIDER_SITE_OTHER): Payer: Medicaid Other | Admitting: Certified Nurse Midwife

## 2016-09-08 VITALS — BP 100/66 | HR 85 | Wt 145.0 lb

## 2016-09-08 DIAGNOSIS — M5430 Sciatica, unspecified side: Secondary | ICD-10-CM

## 2016-09-08 DIAGNOSIS — O98313 Other infections with a predominantly sexual mode of transmission complicating pregnancy, third trimester: Secondary | ICD-10-CM

## 2016-09-08 DIAGNOSIS — R894 Abnormal immunological findings in specimens from other organs, systems and tissues: Secondary | ICD-10-CM

## 2016-09-08 DIAGNOSIS — O98319 Other infections with a predominantly sexual mode of transmission complicating pregnancy, unspecified trimester: Secondary | ICD-10-CM

## 2016-09-08 DIAGNOSIS — Z3480 Encounter for supervision of other normal pregnancy, unspecified trimester: Secondary | ICD-10-CM

## 2016-09-08 DIAGNOSIS — A6009 Herpesviral infection of other urogenital tract: Secondary | ICD-10-CM | POA: Insufficient documentation

## 2016-09-08 DIAGNOSIS — Z3009 Encounter for other general counseling and advice on contraception: Secondary | ICD-10-CM

## 2016-09-08 DIAGNOSIS — F1190 Opioid use, unspecified, uncomplicated: Secondary | ICD-10-CM

## 2016-09-08 DIAGNOSIS — Z3483 Encounter for supervision of other normal pregnancy, third trimester: Secondary | ICD-10-CM

## 2016-09-08 MED ORDER — CYCLOBENZAPRINE HCL 10 MG PO TABS: 10.0000 mg | ORAL_TABLET | Freq: Three times a day (TID) | ORAL | 1 refills | Status: DC | PRN

## 2016-09-08 MED ORDER — COMFORT FIT MATERNITY SUPP MED MISC: 1.0000 [IU] | Freq: Every day | 0 refills | Status: DC

## 2016-09-08 NOTE — Progress Notes (Signed)
Pt complains of having lower back pain. 

## 2016-09-08 NOTE — Progress Notes (Signed)
   PRENATAL VISIT NOTE  Subjective:  Krystal Barajas is a 36 y.o. G3P2002 at 4638w2d being seen today for ongoing prenatal care.  She is currently monitored for the following issues for this low-risk pregnancy and has Supervision of other normal pregnancy, antepartum; Herpes simplex antibody positive; Opiate use; Unwanted fertility; and Genital herpes affecting pregnancy, antepartum on her problem list.  Patient reports backache, no bleeding, no contractions, no cramping and no leaking.  Contractions: Irregular. Vag. Bleeding: None.  Movement: Present. Denies leaking of fluid.   The following portions of the patient's history were reviewed and updated as appropriate: allergies, current medications, past family history, past medical history, past social history, past surgical history and problem list. Problem list updated.  Objective:   Vitals:   09/08/16 1057  BP: 100/66  Pulse: 85  Weight: 145 lb (65.8 kg)    Fetal Status: Fetal Heart Rate (bpm): 140 Fundal Height: 32 cm Movement: Present     General:  Alert, oriented and cooperative. Patient is in no acute distress.  Skin: Skin is warm and dry. No rash noted.   Cardiovascular: Normal heart rate noted  Respiratory: Normal respiratory effort, no problems with respiration noted  Abdomen: Soft, gravid, appropriate for gestational age. Pain/Pressure: Absent     Pelvic:  Cervical exam deferred        Extremities: Normal range of motion.  Edema: None  Mental Status: Normal mood and affect. Normal behavior. Normal judgment and thought content.   Assessment and Plan:  Pregnancy: G3P2002 at 3138w2d  1. Supervision of other normal pregnancy, antepartum     Chronic back pain/Sciatica  2. 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 Unwanted fertility     BTL planned/consent 08/27/16.    4. Herpes simplex antibody positive     Valtrex @36  weeks discussed  5. Sciatica  - Elastic  Bandages & Supports (COMFORT FIT MATERNITY SUPP MED) MISC; 1 Units by Does not apply route daily.  Dispense: 1 each; Refill: 0 - cyclobenzaprine (FLEXERIL) 10 MG tablet; Take 1 tablet (10 mg total) by mouth every 8 (eight) hours as needed for muscle spasms.  Dispense: 30 tablet; Refill: 1  6. Opiate use     Hx of opiate dependence was treated in 2013.  Denies any chronic substance abuse currently.   Preterm labor symptoms and general obstetric precautions including but not limited to vaginal bleeding, contractions, leaking of fluid and fetal movement were reviewed in detail with the patient. Please refer to After Visit Summary for other counseling recommendations.  Return in about 2 weeks (around 09/22/2016) for ROB, GBS.   Roe Coombsachelle A Denney, CNM

## 2016-09-23 ENCOUNTER — Other Ambulatory Visit (HOSPITAL_COMMUNITY)
Admission: RE | Admit: 2016-09-23 | Discharge: 2016-09-23 | Disposition: A | Discharge status: Home / Self Care | Payer: Medicaid Other | Source: Ambulatory Visit | Attending: Certified Nurse Midwife | Admitting: Certified Nurse Midwife

## 2016-09-23 ENCOUNTER — Ambulatory Visit (INDEPENDENT_AMBULATORY_CARE_PROVIDER_SITE_OTHER): Payer: Medicaid Other | Admitting: Certified Nurse Midwife

## 2016-09-23 VITALS — BP 114/74 | HR 93 | Wt 148.0 lb

## 2016-09-23 DIAGNOSIS — A6009 Herpesviral infection of other urogenital tract: Secondary | ICD-10-CM

## 2016-09-23 DIAGNOSIS — Z3490 Encounter for supervision of normal pregnancy, unspecified, unspecified trimester: Secondary | ICD-10-CM

## 2016-09-23 DIAGNOSIS — Z3483 Encounter for supervision of other normal pregnancy, third trimester: Secondary | ICD-10-CM

## 2016-09-23 DIAGNOSIS — O98319 Other infections with a predominantly sexual mode of transmission complicating pregnancy, unspecified trimester: Secondary | ICD-10-CM

## 2016-09-23 DIAGNOSIS — Z3009 Encounter for other general counseling and advice on contraception: Secondary | ICD-10-CM

## 2016-09-23 DIAGNOSIS — Z3480 Encounter for supervision of other normal pregnancy, unspecified trimester: Secondary | ICD-10-CM | POA: Insufficient documentation

## 2016-09-23 LAB — OB RESULTS CONSOLE GBS: STREP GROUP B AG: POSITIVE

## 2016-09-23 MED ORDER — VALACYCLOVIR HCL 500 MG PO TABS: 500.0000 mg | ORAL_TABLET | Freq: Two times a day (BID) | ORAL | 3 refills | Status: DC

## 2016-09-23 NOTE — Progress Notes (Signed)
   PRENATAL VISIT NOTE  Subjective:  Krystal Barajas is a 36 y.o. G3P2002 at 3923w3d being seen today for ongoing prenatal care.  She is currently monitored for the following issues for this low-risk pregnancy and has Supervision of other normal pregnancy, antepartum; Herpes simplex antibody positive; Opiate use; Unwanted fertility; and Genital herpes affecting pregnancy, antepartum on her problem list.  Patient reports backache, no bleeding, no leaking and occasional contractions.  Contractions: Irregular. Vag. Bleeding: None.  Movement: Present. Denies leaking of fluid.   The following portions of the patient's history were reviewed and updated as appropriate: allergies, current medications, past family history, past medical history, past social history, past surgical history and problem list. Problem list updated.  Objective:   Vitals:   09/23/16 1129  BP: 114/74  Pulse: 93  Weight: 148 lb (67.1 kg)    Fetal Status: Fetal Heart Rate (bpm): 150 Fundal Height: 35 cm Movement: Present  Presentation: Vertex  General:  Alert, oriented and cooperative. Patient is in no acute distress.  Skin: Skin is warm and dry. No rash noted.   Cardiovascular: Normal heart rate noted  Respiratory: Normal respiratory effort, no problems with respiration noted  Abdomen: Soft, gravid, appropriate for gestational age. Pain/Pressure: Present     Pelvic:  Cervical exam performed Dilation: 2 Effacement (%): 50 Station: Ballotable  Extremities: Normal range of motion.  Edema: None  Mental Status: Normal mood and affect. Normal behavior. Normal judgment and thought content.   Assessment and Plan:  Pregnancy: G3P2002 at 1723w3d  1. Supervision of other normal pregnancy, antepartum      Doing well - Cervicovaginal ancillary only - Strep Gp B NAA  2. Genital herpes affecting pregnancy, antepartum      - valACYclovir (VALTREX) 500 MG tablet; Take 1 tablet (500 mg total) by mouth 2 (two) times daily. Start at 36  weeks for herpes prophylaxis.  Dispense: 30 tablet; Refill: 3  3. Encounter for supervision of normal pregnancy, antepartum, unspecified gravidity       4. Unwanted fertility      BTL planned.   Preterm labor symptoms and general obstetric precautions including but not limited to vaginal bleeding, contractions, leaking of fluid and fetal movement were reviewed in detail with the patient. Please refer to After Visit Summary for other counseling recommendations.  Return in about 1 week (around 09/30/2016) for ROB.   Roe Coombsachelle A Denney, CNM

## 2016-09-23 NOTE — Progress Notes (Signed)
Patient has a lot of pressure and pain in pelvis at times. Patient has pain in her R hip.

## 2016-09-24 LAB — CERVICOVAGINAL ANCILLARY ONLY
Bacterial vaginitis: NEGATIVE
Candida vaginitis: POSITIVE — AB
Chlamydia: NEGATIVE
NEISSERIA GONORRHEA: NEGATIVE
TRICH (WINDOWPATH): NEGATIVE

## 2016-09-25 ENCOUNTER — Other Ambulatory Visit: Payer: Self-pay | Admitting: Certified Nurse Midwife

## 2016-09-25 DIAGNOSIS — B373 Candidiasis of vulva and vagina: Secondary | ICD-10-CM

## 2016-09-25 DIAGNOSIS — Z3480 Encounter for supervision of other normal pregnancy, unspecified trimester: Secondary | ICD-10-CM

## 2016-09-25 DIAGNOSIS — B3731 Acute candidiasis of vulva and vagina: Secondary | ICD-10-CM

## 2016-09-25 DIAGNOSIS — O99820 Streptococcus B carrier state complicating pregnancy: Secondary | ICD-10-CM | POA: Insufficient documentation

## 2016-09-25 LAB — STREP GP B NAA: Strep Gp B NAA: POSITIVE — AB

## 2016-09-25 MED ORDER — FLUCONAZOLE 150 MG PO TABS: 150.0000 mg | ORAL_TABLET | Freq: Once | ORAL | 0 refills | Status: AC

## 2016-09-25 MED ORDER — TERCONAZOLE 0.8 % VA CREA: 1.0000 | TOPICAL_CREAM | Freq: Every day | VAGINAL | 0 refills | Status: DC

## 2016-10-02 ENCOUNTER — Encounter: Payer: Self-pay | Admitting: Certified Nurse Midwife

## 2016-10-02 ENCOUNTER — Ambulatory Visit (INDEPENDENT_AMBULATORY_CARE_PROVIDER_SITE_OTHER): Payer: Medicaid Other | Admitting: Certified Nurse Midwife

## 2016-10-02 VITALS — BP 109/67 | HR 81 | Wt 149.6 lb

## 2016-10-02 DIAGNOSIS — O98319 Other infections with a predominantly sexual mode of transmission complicating pregnancy, unspecified trimester: Secondary | ICD-10-CM

## 2016-10-02 DIAGNOSIS — Z3480 Encounter for supervision of other normal pregnancy, unspecified trimester: Secondary | ICD-10-CM

## 2016-10-02 DIAGNOSIS — Z3483 Encounter for supervision of other normal pregnancy, third trimester: Secondary | ICD-10-CM

## 2016-10-02 DIAGNOSIS — O99820 Streptococcus B carrier state complicating pregnancy: Secondary | ICD-10-CM

## 2016-10-02 DIAGNOSIS — A6009 Herpesviral infection of other urogenital tract: Secondary | ICD-10-CM

## 2016-10-02 NOTE — Patient Instructions (Signed)
AREA PEDIATRIC/FAMILY PRACTICE PHYSICIANS   [facility] [address], [address] [place], [place]  [address] Phone - [phone]   Fax - [phone]  [facility] [address] [place], [place] [address] Phone - [phone]   Fax - [phone]  [facility] [address] [place], [place]  [address] Phone - [phone]   Fax - [phone]  [facility] [address] [place], [place]  [address] Phone - [phone]   Fax - [phone]  [facility] [address] [place], [place]  [address] Phone - [phone]   Fax - [phone]  [facility] [address] [place], [place]  [address] Phone - [phone]   Fax - [phone]  [facility] [address] [place], [place]  [address] Phone - [phone]   Fax - [phone]  [facility] [address] [place], [place]  [address] Phone - [phone]   Fax - [phone]  [facility] [address] [place], [place]  [address] Phone - [phone]   Fax - [phone] [facility] [address] [place], [place]  [address] Phone - [phone]   Fax - [phone]  [facility] [address] [place], [place]  [address] Phone - [phone]   Fax - [phone]  [facility] [address] [place], [place]  [address] Phone - [phone]   Fax - [phone]  [facility] [address] [place], [place]  [address] Phone - [phone]   Fax - [phone]  [facility] [address] [place], [place]  [address] Phone - [phone]  [facility] [address] [place], [place]  [address] Phone - [phone]   Fax - [phone]  [facility] [address] [place], [place]  [address] Phone - [phone]   Fax - [phone]  [facility] [address] [place], [place]  [address] Phone - [phone]   Fax - [phone]  [facility] [address] [place], [place]  [address] Phone - [phone]   Fax - [phone]   [facility] [address] [place], [place]  [address] Phone - [phone]   Fax - [phone]  [facility] [address] [place], [place]  [address] Phone - [phone]   Fax - [phone]  [facility] [address] [place], [place]  [address] Phone - [phone]   Fax - [phone]  [facility] [address] [place], [place]  [address] Phone - [phone]   Fax - [phone]  [facility] [address] [place], [place]  [address] Phone - [phone]   Fax - [phone] [facility] [address] [place], [place]  [address] Phone - [phone]   Fax - [phone]  [facility] [address] [place], [place]  [address] Phone - [phone]   Fax - [phone]  [facility] [address] [place], [place]  [address] Phone - [phone]   Fax - [phone]  [facility] [place] Charlene Flemming MD [address] [place] [address] Phone [phone]  Fax [phone]   

## 2016-10-02 NOTE — Progress Notes (Signed)
   PRENATAL VISIT NOTE  Subjective:  Krystal Barajas is a 36 y.o. G3P2002 at 4057w5d being seen today for ongoing prenatal care.  She is currently monitored for the following issues for this low-risk pregnancy and has Supervision of other normal pregnancy, antepartum; Herpes simplex antibody positive; Opiate use; Unwanted fertility; Genital herpes affecting pregnancy, antepartum; and GBS (group B Streptococcus carrier), +RV culture, currently pregnant on her problem list.  Patient reports backache, no bleeding, no leaking and occasional contractions.  Contractions: Irregular. Vag. Bleeding: None.  Movement: Present. Denies leaking of fluid.   The following portions of the patient's history were reviewed and updated as appropriate: allergies, current medications, past family history, past medical history, past social history, past surgical history and problem list. Problem list updated.  Objective:   Vitals:   10/02/16 0828  BP: 109/67  Pulse: 81  Weight: 149 lb 9.6 oz (67.9 kg)    Fetal Status: Fetal Heart Rate (bpm): 127 Fundal Height: 36 cm Movement: Present     General:  Alert, oriented and cooperative. Patient is in no acute distress.  Skin: Skin is warm and dry. No rash noted.   Cardiovascular: Normal heart rate noted  Respiratory: Normal respiratory effort, no problems with respiration noted  Abdomen: Soft, gravid, appropriate for gestational age.  Pain/Pressure: Present     Pelvic: Cervical exam deferred        Extremities: Normal range of motion.  Edema: None  Mental Status:  Normal mood and affect. Normal behavior. Normal judgment and thought content.   Assessment and Plan:  Pregnancy: G3P2002 at 3157w5d  1. Supervision of other normal pregnancy, antepartum      Doing well.  Has maternity support belt that she is using, is taking valtrex.    2. Genital herpes affecting pregnancy, antepartum     On valtrex suppression  3. GBS (group B Streptococcus carrier), +RV culture,  currently pregnant      PCN for labor/delivery  Preterm labor symptoms and general obstetric precautions including but not limited to vaginal bleeding, contractions, leaking of fluid and fetal movement were reviewed in detail with the patient. Please refer to After Visit Summary for other counseling recommendations.  Return in about 1 week (around 10/09/2016) for ROB.   Roe Coombsachelle A Lutie Pickler, CNM

## 2016-10-02 NOTE — Progress Notes (Signed)
Patient reports good fetal movement, with contractions and pressure that comes and goes.

## 2016-10-12 ENCOUNTER — Encounter: Payer: Medicaid Other | Admitting: Certified Nurse Midwife

## 2016-10-13 ENCOUNTER — Other Ambulatory Visit (HOSPITAL_COMMUNITY)
Admission: RE | Admit: 2016-10-13 | Discharge: 2016-10-13 | Disposition: A | Discharge status: Home / Self Care | Payer: Medicaid Other | Source: Ambulatory Visit | Attending: Certified Nurse Midwife | Admitting: Certified Nurse Midwife

## 2016-10-13 ENCOUNTER — Encounter: Payer: Self-pay | Admitting: Certified Nurse Midwife

## 2016-10-13 ENCOUNTER — Ambulatory Visit (INDEPENDENT_AMBULATORY_CARE_PROVIDER_SITE_OTHER): Payer: Medicaid Other | Admitting: Certified Nurse Midwife

## 2016-10-13 VITALS — Wt 151.2 lb

## 2016-10-13 DIAGNOSIS — Z3480 Encounter for supervision of other normal pregnancy, unspecified trimester: Secondary | ICD-10-CM | POA: Insufficient documentation

## 2016-10-13 DIAGNOSIS — O99820 Streptococcus B carrier state complicating pregnancy: Secondary | ICD-10-CM

## 2016-10-13 DIAGNOSIS — R894 Abnormal immunological findings in specimens from other organs, systems and tissues: Secondary | ICD-10-CM

## 2016-10-13 DIAGNOSIS — Z3483 Encounter for supervision of other normal pregnancy, third trimester: Secondary | ICD-10-CM

## 2016-10-13 NOTE — Progress Notes (Signed)
Patient reports good fetal movement with irregular contractions and pressure. Pt complains of burning lower back pain and states that carpal tunnel is so bad that she is having troible sleeping at night.

## 2016-10-13 NOTE — Progress Notes (Signed)
   PRENATAL VISIT NOTE  Subjective:  Krystal Barajas is a 36 y.o. G3P2002 at 1177w2d being seen today for ongoing prenatal care.  She is currently monitored for the following issues for this low-risk pregnancy and has Supervision of other normal pregnancy, antepartum; Herpes simplex antibody positive; Opiate use; Unwanted fertility; Genital herpes affecting pregnancy, antepartum; and GBS (group B Streptococcus carrier), +RV culture, currently pregnant on her problem list.  Patient reports no bleeding, no contractions, no cramping, no leaking, vaginal irritation and increased carpel tunnel symptoms.  Contractions: Irregular. Vag. Bleeding: None.  Movement: Present. Denies leaking of fluid.   The following portions of the patient's history were reviewed and updated as appropriate: allergies, current medications, past family history, past medical history, past social history, past surgical history and problem list. Problem list updated.  Objective:   Vitals:   10/13/16 1321  Weight: 151 lb 3.2 oz (68.6 kg)    Fetal Status: Fetal Heart Rate (bpm): 138 Fundal Height: 35 cm Movement: Present  Presentation: Vertex  General:  Alert, oriented and cooperative. Patient is in no acute distress.  Skin: Skin is warm and dry. No rash noted.   Cardiovascular: Normal heart rate noted  Respiratory: Normal respiratory effort, no problems with respiration noted  Abdomen: Soft, gravid, appropriate for gestational age.  Pain/Pressure: Present     Pelvic: Cervical exam performed Dilation: 2.5 Effacement (%): 50 Station: -3  Extremities: Normal range of motion.  Edema: Trace  Mental Status:  Normal mood and affect. Normal behavior. Normal judgment and thought content.   Assessment and Plan:  Pregnancy: G3P2002 at 1477w2d  1. Supervision of other normal pregnancy, antepartum     Carpel tunnel symptoms have increased.    2. Herpes simplex antibody positive     On Valtrex  3. GBS (group B Streptococcus  carrier), +RV culture, currently pregnant     PCN for labor/delivery  Term labor symptoms and general obstetric precautions including but not limited to vaginal bleeding, contractions, leaking of fluid and fetal movement were reviewed in detail with the patient. Please refer to After Visit Summary for other counseling recommendations.  Return in about 1 week (around 10/20/2016) for ROB.   Roe Coombsachelle A Bronco Mcgrory, CNM

## 2016-10-13 NOTE — Addendum Note (Signed)
Addended by: Natale MilchSTALLING, Raenah Murley D on: 10/13/2016 03:29 PM   Modules accepted: Orders

## 2016-10-16 ENCOUNTER — Encounter (HOSPITAL_COMMUNITY)
Admission: AD | Disposition: A | Discharge status: Home / Self Care | Payer: Self-pay | Source: Ambulatory Visit | Attending: Obstetrics & Gynecology

## 2016-10-16 ENCOUNTER — Inpatient Hospital Stay (HOSPITAL_COMMUNITY)
Admission: AD | Admit: 2016-10-16 | Discharge: 2016-10-18 | DRG: 767 | Disposition: A | Discharge status: Home / Self Care | Payer: Medicaid Other | Source: Ambulatory Visit | Attending: Obstetrics & Gynecology | Admitting: Obstetrics & Gynecology

## 2016-10-16 ENCOUNTER — Encounter (HOSPITAL_COMMUNITY): Payer: Self-pay

## 2016-10-16 ENCOUNTER — Inpatient Hospital Stay (HOSPITAL_COMMUNITY): Payer: Medicaid Other | Admitting: Certified Registered Nurse Anesthetist

## 2016-10-16 ENCOUNTER — Inpatient Hospital Stay (HOSPITAL_COMMUNITY): Payer: Medicaid Other | Admitting: Anesthesiology

## 2016-10-16 DIAGNOSIS — F1190 Opioid use, unspecified, uncomplicated: Secondary | ICD-10-CM | POA: Diagnosis present

## 2016-10-16 DIAGNOSIS — Z3493 Encounter for supervision of normal pregnancy, unspecified, third trimester: Secondary | ICD-10-CM | POA: Diagnosis present

## 2016-10-16 DIAGNOSIS — O99824 Streptococcus B carrier state complicating childbirth: Secondary | ICD-10-CM | POA: Diagnosis present

## 2016-10-16 DIAGNOSIS — Z3A38 38 weeks gestation of pregnancy: Secondary | ICD-10-CM

## 2016-10-16 DIAGNOSIS — O9902 Anemia complicating childbirth: Secondary | ICD-10-CM | POA: Diagnosis present

## 2016-10-16 DIAGNOSIS — F17200 Nicotine dependence, unspecified, uncomplicated: Secondary | ICD-10-CM | POA: Diagnosis present

## 2016-10-16 DIAGNOSIS — A6000 Herpesviral infection of urogenital system, unspecified: Secondary | ICD-10-CM | POA: Diagnosis present

## 2016-10-16 DIAGNOSIS — D649 Anemia, unspecified: Secondary | ICD-10-CM | POA: Diagnosis present

## 2016-10-16 DIAGNOSIS — Z302 Encounter for sterilization: Secondary | ICD-10-CM | POA: Diagnosis not present

## 2016-10-16 DIAGNOSIS — Z3480 Encounter for supervision of other normal pregnancy, unspecified trimester: Secondary | ICD-10-CM

## 2016-10-16 DIAGNOSIS — O9832 Other infections with a predominantly sexual mode of transmission complicating childbirth: Principal | ICD-10-CM | POA: Diagnosis present

## 2016-10-16 DIAGNOSIS — O99324 Drug use complicating childbirth: Secondary | ICD-10-CM | POA: Diagnosis present

## 2016-10-16 DIAGNOSIS — Z9851 Tubal ligation status: Secondary | ICD-10-CM

## 2016-10-16 DIAGNOSIS — O99334 Smoking (tobacco) complicating childbirth: Secondary | ICD-10-CM | POA: Diagnosis present

## 2016-10-16 HISTORY — PX: TUBAL LIGATION: SHX77

## 2016-10-16 LAB — RAPID URINE DRUG SCREEN, HOSP PERFORMED
Amphetamines: NOT DETECTED
BARBITURATES: NOT DETECTED
BENZODIAZEPINES: NOT DETECTED
COCAINE: NOT DETECTED
Opiates: NOT DETECTED
TETRAHYDROCANNABINOL: POSITIVE — AB

## 2016-10-16 LAB — DIC (DISSEMINATED INTRAVASCULAR COAGULATION)PANEL
INR: 0.99
Platelets: 288 10*3/uL (ref 150–400)
Prothrombin Time: 13.1 seconds (ref 11.4–15.2)
aPTT: 28 seconds (ref 24–36)

## 2016-10-16 LAB — CBC
HCT: 37.9 % (ref 36.0–46.0)
HCT: 37.8 % (ref 36.0–46.0)
Hemoglobin: 13.3 g/dL (ref 12.0–15.0)
Hemoglobin: 13 g/dL (ref 12.0–15.0)
MCH: 31.4 pg (ref 26.0–34.0)
MCH: 31.3 pg (ref 26.0–34.0)
MCHC: 35.1 g/dL (ref 30.0–36.0)
MCHC: 34.4 g/dL (ref 30.0–36.0)
MCV: 89.6 fL (ref 78.0–100.0)
MCV: 90.9 fL (ref 78.0–100.0)
Platelets: 304 10*3/uL (ref 150–400)
Platelets: 293 10*3/uL (ref 150–400)
RBC: 4.23 MIL/uL (ref 3.87–5.11)
RBC: 4.16 MIL/uL (ref 3.87–5.11)
RDW: 13.7 % (ref 11.5–15.5)
RDW: 13.8 % (ref 11.5–15.5)
WBC: 18.9 10*3/uL — ABNORMAL HIGH (ref 4.0–10.5)
WBC: 32.8 10*3/uL — ABNORMAL HIGH (ref 4.0–10.5)

## 2016-10-16 LAB — DIC (DISSEMINATED INTRAVASCULAR COAGULATION) PANEL
D DIMER QUANT: 1.73 ug{FEU}/mL — AB (ref 0.00–0.50)
FIBRINOGEN: 435 mg/dL (ref 210–475)
SMEAR REVIEW: NONE SEEN

## 2016-10-16 LAB — ABO/RH: ABO/RH(D): O POS

## 2016-10-16 LAB — CERVICOVAGINAL ANCILLARY ONLY
BACTERIAL VAGINITIS: NEGATIVE
CANDIDA VAGINITIS: NEGATIVE
CHLAMYDIA, DNA PROBE: NEGATIVE
Neisseria Gonorrhea: NEGATIVE
TRICH (WINDOWPATH): NEGATIVE

## 2016-10-16 LAB — TYPE AND SCREEN
ABO/RH(D): O POS
ANTIBODY SCREEN: NEGATIVE

## 2016-10-16 SURGERY — LIGATION, FALLOPIAN TUBE, POSTPARTUM
Anesthesia: Epidural | Laterality: Bilateral

## 2016-10-16 MED ORDER — FENTANYL CITRATE (PF) 100 MCG/2ML IJ SOLN: 25.0000 ug | INTRAMUSCULAR | Status: DC | PRN

## 2016-10-16 MED ORDER — MIDAZOLAM HCL 2 MG/2ML IJ SOLN
INTRAMUSCULAR | Status: AC
  Filled 2016-10-16: qty 2

## 2016-10-16 MED ORDER — WITCH HAZEL-GLYCERIN EX PADS: 1.0000 "application " | MEDICATED_PAD | CUTANEOUS | Status: DC | PRN

## 2016-10-16 MED ORDER — OXYCODONE-ACETAMINOPHEN 5-325 MG PO TABS: 2.0000 | ORAL_TABLET | ORAL | Status: DC | PRN

## 2016-10-16 MED ORDER — LIDOCAINE HCL (PF) 1 % IJ SOLN
INTRAMUSCULAR | Status: DC | PRN
  Administered 2016-10-16: 5 mL
  Administered 2016-10-16: 9 mL

## 2016-10-16 MED ORDER — EPHEDRINE 5 MG/ML INJ: 10.0000 mg | INTRAVENOUS | Status: DC | PRN

## 2016-10-16 MED ORDER — ONDANSETRON HCL 4 MG/2ML IJ SOLN
INTRAMUSCULAR | Status: DC | PRN
  Administered 2016-10-16: 4 mg via INTRAVENOUS

## 2016-10-16 MED ORDER — MISOPROSTOL 200 MCG PO TABS
1000.0000 ug | ORAL_TABLET | Freq: Once | ORAL | Status: AC
  Administered 2016-10-16: 1000 ug via RECTAL

## 2016-10-16 MED ORDER — LACTATED RINGERS IV SOLN: 500.0000 mL | INTRAVENOUS | Status: DC | PRN

## 2016-10-16 MED ORDER — ONDANSETRON HCL 4 MG PO TABS: 4.0000 mg | ORAL_TABLET | ORAL | Status: DC | PRN

## 2016-10-16 MED ORDER — DIBUCAINE 1 % RE OINT: 1.0000 "application " | TOPICAL_OINTMENT | RECTAL | Status: DC | PRN

## 2016-10-16 MED ORDER — ACETAMINOPHEN 325 MG PO TABS: 650.0000 mg | ORAL_TABLET | ORAL | Status: DC | PRN

## 2016-10-16 MED ORDER — SIMETHICONE 80 MG PO CHEW: 80.0000 mg | CHEWABLE_TABLET | ORAL | Status: DC | PRN

## 2016-10-16 MED ORDER — FENTANYL 2.5 MCG/ML BUPIVACAINE 1/10 % EPIDURAL INFUSION (WH - ANES): 14.0000 mL/h | INTRAMUSCULAR | Status: DC | PRN

## 2016-10-16 MED ORDER — TETANUS-DIPHTH-ACELL PERTUSSIS 5-2.5-18.5 LF-MCG/0.5 IM SUSP: 0.5000 mL | Freq: Once | INTRAMUSCULAR | Status: DC

## 2016-10-16 MED ORDER — PHENYLEPHRINE 40 MCG/ML (10ML) SYRINGE FOR IV PUSH (FOR BLOOD PRESSURE SUPPORT): 80.0000 ug | PREFILLED_SYRINGE | INTRAVENOUS | Status: DC | PRN

## 2016-10-16 MED ORDER — PROMETHAZINE HCL 25 MG/ML IJ SOLN
6.2500 mg | INTRAMUSCULAR | Status: DC | PRN
Start: 2016-10-16 — End: 2016-10-16

## 2016-10-16 MED ORDER — ONDANSETRON HCL 4 MG/2ML IJ SOLN
INTRAMUSCULAR | Status: AC
  Filled 2016-10-16: qty 2

## 2016-10-16 MED ORDER — SCOPOLAMINE 1 MG/3DAYS TD PT72
MEDICATED_PATCH | TRANSDERMAL | Status: DC | PRN
  Administered 2016-10-16: 1 via TRANSDERMAL

## 2016-10-16 MED ORDER — LIDOCAINE HCL (PF) 1 % IJ SOLN
INTRAMUSCULAR | Status: AC
  Filled 2016-10-16: qty 30

## 2016-10-16 MED ORDER — DIPHENHYDRAMINE HCL 50 MG/ML IJ SOLN
12.5000 mg | INTRAMUSCULAR | Status: DC | PRN
Start: 2016-10-16 — End: 2016-10-16

## 2016-10-16 MED ORDER — FENTANYL 2.5 MCG/ML BUPIVACAINE 1/10 % EPIDURAL INFUSION (WH - ANES)
14.0000 mL/h | INTRAMUSCULAR | Status: DC | PRN
  Administered 2016-10-16: 14 mL/h via EPIDURAL
  Filled 2016-10-16: qty 100

## 2016-10-16 MED ORDER — OXYTOCIN 40 UNITS IN LACTATED RINGERS INFUSION - SIMPLE MED
INTRAVENOUS | Status: AC
  Administered 2016-10-16: 2.5 [IU]/h via INTRAVENOUS
  Filled 2016-10-16: qty 1000

## 2016-10-16 MED ORDER — ZOLPIDEM TARTRATE 5 MG PO TABS: 5.0000 mg | ORAL_TABLET | Freq: Every evening | ORAL | Status: DC | PRN

## 2016-10-16 MED ORDER — FLEET ENEMA 7-19 GM/118ML RE ENEM: 1.0000 | ENEMA | RECTAL | Status: DC | PRN

## 2016-10-16 MED ORDER — PRENATAL MULTIVITAMIN CH
1.0000 | ORAL_TABLET | Freq: Every day | ORAL | Status: DC
  Administered 2016-10-17: 1 via ORAL
  Filled 2016-10-16 (×2): qty 1

## 2016-10-16 MED ORDER — BENZOCAINE-MENTHOL 20-0.5 % EX AERO
1.0000 "application " | INHALATION_SPRAY | CUTANEOUS | Status: DC | PRN
  Administered 2016-10-16: 1 via TOPICAL
  Filled 2016-10-16: qty 56

## 2016-10-16 MED ORDER — SOD CITRATE-CITRIC ACID 500-334 MG/5ML PO SOLN
30.0000 mL | ORAL | Status: DC | PRN
  Administered 2016-10-16: 30 mL via ORAL
  Filled 2016-10-16: qty 15

## 2016-10-16 MED ORDER — ONDANSETRON HCL 4 MG/2ML IJ SOLN: 4.0000 mg | INTRAMUSCULAR | Status: DC | PRN

## 2016-10-16 MED ORDER — LIDOCAINE-EPINEPHRINE (PF) 2 %-1:200000 IJ SOLN
INTRAMUSCULAR | Status: AC
  Filled 2016-10-16: qty 20

## 2016-10-16 MED ORDER — DEXAMETHASONE SODIUM PHOSPHATE 4 MG/ML IJ SOLN
INTRAMUSCULAR | Status: DC | PRN
  Administered 2016-10-16: 4 mg via INTRAVENOUS

## 2016-10-16 MED ORDER — DIPHENHYDRAMINE HCL 25 MG PO CAPS: 25.0000 mg | ORAL_CAPSULE | Freq: Four times a day (QID) | ORAL | Status: DC | PRN

## 2016-10-16 MED ORDER — SODIUM BICARBONATE 8.4 % IV SOLN
INTRAVENOUS | Status: DC | PRN
  Administered 2016-10-16: 5 mL via EPIDURAL
  Administered 2016-10-16: 10 mL via EPIDURAL
  Administered 2016-10-16: 5 mL via EPIDURAL

## 2016-10-16 MED ORDER — DIPHENHYDRAMINE HCL 50 MG/ML IJ SOLN: 12.5000 mg | INTRAMUSCULAR | Status: DC | PRN

## 2016-10-16 MED ORDER — ONDANSETRON HCL 4 MG/2ML IJ SOLN: 4.0000 mg | Freq: Four times a day (QID) | INTRAMUSCULAR | Status: DC | PRN

## 2016-10-16 MED ORDER — MIDAZOLAM HCL 2 MG/2ML IJ SOLN
INTRAMUSCULAR | Status: DC | PRN
  Administered 2016-10-16: 2 mg via INTRAVENOUS

## 2016-10-16 MED ORDER — LACTATED RINGERS IV SOLN: 500.0000 mL | Freq: Once | INTRAVENOUS | Status: DC

## 2016-10-16 MED ORDER — PHENYLEPHRINE 40 MCG/ML (10ML) SYRINGE FOR IV PUSH (FOR BLOOD PRESSURE SUPPORT)
80.0000 ug | PREFILLED_SYRINGE | INTRAVENOUS | Status: DC | PRN
  Filled 2016-10-16: qty 10

## 2016-10-16 MED ORDER — LIDOCAINE HCL (PF) 1 % IJ SOLN: 30.0000 mL | INTRAMUSCULAR | Status: DC | PRN

## 2016-10-16 MED ORDER — SCOPOLAMINE 1 MG/3DAYS TD PT72
MEDICATED_PATCH | TRANSDERMAL | Status: AC
  Filled 2016-10-16: qty 1

## 2016-10-16 MED ORDER — BUPIVACAINE HCL (PF) 0.5 % IJ SOLN
INTRAMUSCULAR | Status: DC | PRN
  Administered 2016-10-16: 15 mL

## 2016-10-16 MED ORDER — DEXAMETHASONE SODIUM PHOSPHATE 4 MG/ML IJ SOLN
INTRAMUSCULAR | Status: AC
  Filled 2016-10-16: qty 1

## 2016-10-16 MED ORDER — SENNOSIDES-DOCUSATE SODIUM 8.6-50 MG PO TABS
2.0000 | ORAL_TABLET | ORAL | Status: DC
  Administered 2016-10-16 – 2016-10-18 (×2): 2 via ORAL
  Filled 2016-10-16 (×2): qty 2

## 2016-10-16 MED ORDER — OXYTOCIN BOLUS FROM INFUSION
500.0000 mL | Freq: Once | INTRAVENOUS | Status: AC
  Administered 2016-10-16: 500 mL via INTRAVENOUS

## 2016-10-16 MED ORDER — SODIUM CHLORIDE 0.9 % IR SOLN
Status: DC | PRN
Start: 2016-10-16 — End: 2016-10-16
  Administered 2016-10-16: 1

## 2016-10-16 MED ORDER — LACTATED RINGERS IV SOLN
INTRAVENOUS | Status: DC
  Administered 2016-10-16: 15:00:00 via INTRAVENOUS
  Administered 2016-10-16: 125 mL/h via INTRAVENOUS
  Administered 2016-10-16: 15:00:00 via INTRAVENOUS

## 2016-10-16 MED ORDER — BUPIVACAINE HCL (PF) 0.5 % IJ SOLN
INTRAMUSCULAR | Status: AC
  Filled 2016-10-16: qty 30

## 2016-10-16 MED ORDER — OXYCODONE-ACETAMINOPHEN 5-325 MG PO TABS
1.0000 | ORAL_TABLET | ORAL | Status: DC | PRN
Start: 2016-10-16 — End: 2016-10-16

## 2016-10-16 MED ORDER — METHYLERGONOVINE MALEATE 0.2 MG/ML IJ SOLN
0.2000 mg | Freq: Once | INTRAMUSCULAR | Status: AC
  Administered 2016-10-16: 0.2 mg via INTRAMUSCULAR

## 2016-10-16 MED ORDER — FENTANYL CITRATE (PF) 100 MCG/2ML IJ SOLN
INTRAMUSCULAR | Status: DC | PRN
  Administered 2016-10-16: 100 ug via INTRAVENOUS

## 2016-10-16 MED ORDER — COCONUT OIL OIL: 1.0000 "application " | TOPICAL_OIL | Status: DC | PRN

## 2016-10-16 MED ORDER — FENTANYL CITRATE (PF) 100 MCG/2ML IJ SOLN
INTRAMUSCULAR | Status: AC
  Filled 2016-10-16: qty 2

## 2016-10-16 MED ORDER — OXYTOCIN 40 UNITS IN LACTATED RINGERS INFUSION - SIMPLE MED
2.5000 [IU]/h | INTRAVENOUS | Status: DC
  Administered 2016-10-16: 2.5 [IU]/h via INTRAVENOUS

## 2016-10-16 MED ORDER — LORATADINE 10 MG PO TABS
10.0000 mg | ORAL_TABLET | Freq: Every day | ORAL | Status: DC
  Filled 2016-10-16: qty 1

## 2016-10-16 MED ORDER — MISOPROSTOL 200 MCG PO TABS
ORAL_TABLET | ORAL | Status: AC
  Administered 2016-10-16: 1000 ug via RECTAL
  Filled 2016-10-16: qty 5

## 2016-10-16 MED ORDER — SODIUM CHLORIDE 0.9 % IV SOLN
2.0000 g | Freq: Once | INTRAVENOUS | Status: AC
  Administered 2016-10-16: 2 g via INTRAVENOUS
  Filled 2016-10-16: qty 2000

## 2016-10-16 MED ORDER — IBUPROFEN 600 MG PO TABS
600.0000 mg | ORAL_TABLET | Freq: Four times a day (QID) | ORAL | Status: DC
  Administered 2016-10-16 – 2016-10-18 (×7): 600 mg via ORAL
  Filled 2016-10-16 (×8): qty 1

## 2016-10-16 MED ORDER — METHYLERGONOVINE MALEATE 0.2 MG/ML IJ SOLN
INTRAMUSCULAR | Status: AC
  Administered 2016-10-16: 0.2 mg via INTRAMUSCULAR
  Filled 2016-10-16: qty 1

## 2016-10-16 SURGICAL SUPPLY — 28 items
BENZOIN TINCTURE PRP APPL 2/3 (GAUZE/BANDAGES/DRESSINGS) IMPLANT
BLADE SURG 11 STRL SS (BLADE) ×3 IMPLANT
CLIP FILSHIE TUBAL LIGA STRL (Clip) ×3 IMPLANT
CLOTH BEACON ORANGE TIMEOUT ST (SAFETY) ×3 IMPLANT
DRSG OPSITE POSTOP 3X4 (GAUZE/BANDAGES/DRESSINGS) ×3 IMPLANT
DURAPREP 26ML APPLICATOR (WOUND CARE) ×3 IMPLANT
ELECT REM PT RETURN 9FT ADLT (ELECTROSURGICAL) ×3
ELECTRODE REM PT RTRN 9FT ADLT (ELECTROSURGICAL) ×1 IMPLANT
GLOVE BIOGEL PI IND STRL 6.5 (GLOVE) ×1 IMPLANT
GLOVE BIOGEL PI IND STRL 7.0 (GLOVE) ×3 IMPLANT
GLOVE BIOGEL PI INDICATOR 6.5 (GLOVE) ×2
GLOVE BIOGEL PI INDICATOR 7.0 (GLOVE) ×6
GLOVE ECLIPSE 6.0 STRL STRAW (GLOVE) ×3 IMPLANT
GLOVE ECLIPSE 7.0 STRL STRAW (GLOVE) ×3 IMPLANT
GOWN STRL REUS W/TWL LRG LVL3 (GOWN DISPOSABLE) ×6 IMPLANT
NEEDLE HYPO 22GX1.5 SAFETY (NEEDLE) ×3 IMPLANT
NS IRRIG 1000ML POUR BTL (IV SOLUTION) ×3 IMPLANT
PACK ABDOMINAL MINOR (CUSTOM PROCEDURE TRAY) ×3 IMPLANT
PENCIL BUTTON HOLSTER BLD 10FT (ELECTRODE) ×3 IMPLANT
PROTECTOR NERVE ULNAR (MISCELLANEOUS) ×3 IMPLANT
SPONGE LAP 4X18 X RAY DECT (DISPOSABLE) IMPLANT
SUT VIC AB 0 CT1 27 (SUTURE) ×2
SUT VIC AB 0 CT1 27XBRD ANBCTR (SUTURE) ×1 IMPLANT
SUT VICRYL 4-0 PS2 18IN ABS (SUTURE) ×3 IMPLANT
SYR CONTROL 10ML LL (SYRINGE) ×3 IMPLANT
TOWEL OR 17X24 6PK STRL BLUE (TOWEL DISPOSABLE) ×6 IMPLANT
TRAY FOLEY CATH SILVER 14FR (SET/KITS/TRAYS/PACK) ×3 IMPLANT
WATER STERILE IRR 1000ML POUR (IV SOLUTION) ×3 IMPLANT

## 2016-10-16 NOTE — MAU Note (Signed)
Patient thinks her water broke around 0530.  No LOF now. Some bloody show.  Contractions 2-653min apart.  2cm last week.

## 2016-10-16 NOTE — H&P (Signed)
LABOR AND DELIVERY ADMISSION HISTORY AND PHYSICAL NOTE  Krystal Barajas is a 36 y.o. female G3P2002 with IUP at 34106w5d by LMP + 12wk U/S presenting for spontaneous labor. Reports SROM around 5 am this morning.   She reports positive fetal movement.   Prenatal History/Complications: Clinic [facility] Prenatal Labs  Dating LMP 01/19/16/12 week US Blood type: O/Positive/-- (02/02 1105)   Genetic Screen Declined all Antibody:Negative (02/02 1105)  Anatomic US 06/04/16 - Normal Rubella: 1.02 (02/02 1105)  GTT Third trimester: normal  RPR: Non Reactive (06/12 1050)   Flu vaccine Declined HBsAg: Negative (02/02 1105)   TDaP vaccine Declined                                             HIV:   Neg 08/25/16  Baby Food Breast                                          ZOX:[id number]:Positive (07/11 1206)  Contraception [X ]BTL consent 6/14 Pap: Nml 2017 per pt. Records requested.   Circumcision Yes   Pediatrician Info given   Support Person Mom     Past Medical History: Past Medical History:  Diagnosis Date  . Genital herpes affecting pregnancy, antepartum     Past Surgical History: Past Surgical History:  Procedure Laterality Date  . TONSILLECTOMY      Obstetrical History: OB History    Gravida Para Term Preterm AB Living   3 2 2     2    SAB TAB Ectopic Multiple Live Births           2      Social History: Social History   Social History  . Marital status: Single    Spouse name: N/A  . Number of children: N/A  . Years of education: N/A   Social History Main Topics  . Smoking status: Current Some Day Smoker  . Smokeless tobacco: Never Used  . Alcohol use No  . Drug use: No  . Sexual activity: Yes    Birth control/ protection: None   Other Topics Concern  . None   Social History Narrative  . None    Family History: History reviewed. No pertinent family history.  Allergies: No Known Allergies  Prescriptions Prior to Admission  Medication Sig Dispense Refill Last Dose  .  Acetaminophen (TYLENOL PO) Take by mouth.   Taking  . dicyclomine (BENTYL) 20 MG tablet Take 20 mg by mouth every 6 (six) hours.   Taking  . Elastic Bandages & Supports (COMFORT FIT MATERNITY SUPP MED) MISC 1 Units by Does not apply route daily. 1 each 0 Taking  . loratadine (CLARITIN) 10 MG tablet Take 1 tablet (10 mg total) by mouth daily. 30 tablet 1 Taking  . Prenatal Vit-Fe Fumarate-FA (MULTIVITAMIN-PRENATAL) 27-0.8 MG TABS tablet Take 1 tablet by mouth daily at 12 noon.   Taking  . valACYclovir (VALTREX) 500 MG tablet Take 1 tablet (500 mg total) by mouth 2 (two) times daily. Start at 36 weeks for herpes prophylaxis. 30 tablet 3 Taking     Review of Systems   All systems reviewed and negative except as stated in HPI  Blood pressure 119/68, pulse 80, temperature 98.1 F (36.7 C), temperature source Oral, resp. rate Marland Kitchen(!)  22, height 5\' 4"  (1.626 m), weight 151 lb (68.5 kg), last menstrual period 01/19/2016, SpO2 100 %. General appearance: alert and cooperative Lungs: normal WOB Heart: regular rate and rhythm Abdomen: soft, non-tender; bowel sounds normal Extremities: No calf swelling or tenderness Presentation: cephalic Fetal monitoring: 125, mod variability, _acel, no decel Uterine activity: ctx  q1-2 min Dilation: 7.5 Effacement (%): 90 Station: -1 Exam by:: M.Lee, RNC-OB   Prenatal labs: ABO, Rh: O/Positive/-- (02/02 1105) Antibody: Negative (02/02 1105) Rubella: !Error! RPR: Non Reactive (06/12 1050)  HBsAg: Negative (02/02 1105)  HIV:   neg GBS: Positive (07/11 1206)  Glucola: normal Genetic screening:  declined Anatomy US: normal  Prenatal Transfer Tool  Maternal Diabetes: No Genetic Screening: Declined Maternal Ultrasounds/Referrals: Normal Fetal Ultrasounds or other Referrals:  None Maternal Substance Abuse:  No Significant Maternal Medications:  none Significant Maternal Lab Results: none  No results found for this or any previous visit (from the past 24  hour(s)).  Patient Active Problem List   Diagnosis Date Noted  . Normal labor 10/16/2016  . GBS (group B Streptococcus carrier), +RV culture, currently pregnant 09/25/2016  . Genital herpes affecting pregnancy, antepartum 09/08/2016  . Unwanted fertility 07/09/2016  . Opiate use 04/24/2016  . Herpes simplex antibody positive   . Supervision of other normal pregnancy, antepartum 04/16/2016    Assessment: Krystal AmbleKrystle Barajas is a 36 y.o. G3P2002 at 9731w5d here for SOL/SROM around 0500.  #Labor: expectant management #Pain: Per patient's request #FWB: Cat I #ID:  GBS+, will give amp #MOF: breast #MOC:BTL #Circ:  Yes, outpatient   Kandra NicolasJulie P Soren Pigman 10/16/2016, 9:18 AM

## 2016-10-16 NOTE — Lactation Note (Signed)
This note was copied from a baby's chart. Lactation Consultation Note  Patient Name: Krystal Barajas ZOXWR'UToday's Date: 10/16/2016 Reason for consult: Initial assessment Baby at 6 hr of life and mom is still in surgery. Dad was in the room with baby. Left handouts and instructed him to have mom call at the next bf.   Maternal Data    Feeding Feeding Type: Formula  LATCH Score                   Interventions    Lactation Tools Discussed/Used     Consult Status Consult Status: Follow-up Date: 10/16/16 Follow-up type: In-patient    [name] 10/16/2016, 5:36 PM

## 2016-10-16 NOTE — Transfer of Care (Signed)
Immediate Anesthesia Transfer of Care Note  Patient: Krystal Barajas  Procedure(s) Performed: Procedure(s): POST PARTUM TUBAL LIGATION (Bilateral)  Patient Location: PACU  Anesthesia Type:Epidural  Level of Consciousness: awake, alert  and oriented  Airway & Oxygen Therapy: Patient Spontanous Breathing  Post-op Assessment: Report given to RN and Post -op Vital signs reviewed and stable  Post vital signs: Reviewed and stable  Last Vitals:  Vitals:   10/16/16 1401 10/16/16 1405  BP: (!) 109/56 103/70  Pulse: 71 79  Resp:    Temp:      Last Pain:  Vitals:   10/16/16 1330  TempSrc:   PainSc: 0-No pain         Complications: No apparent anesthesia complications

## 2016-10-16 NOTE — Anesthesia Preprocedure Evaluation (Signed)
Anesthesia Evaluation  Patient identified by MRN, date of birth, ID band Patient awake    Reviewed: Allergy & Precautions, H&P , Patient's Chart, lab work & pertinent test results, reviewed documented beta blocker date and time   Airway Mallampati: II  TM Distance: >3 FB Neck ROM: full    Dental no notable dental hx.    Pulmonary Current Smoker,    Pulmonary exam normal breath sounds clear to auscultation       Cardiovascular  Rhythm:regular Rate:Normal     Neuro/Psych    GI/Hepatic   Endo/Other    Renal/GU      Musculoskeletal   Abdominal   Peds  Hematology   Anesthesia Other Findings   Reproductive/Obstetrics                             Anesthesia Physical  Anesthesia Plan  ASA: II  Anesthesia Plan: Epidural   Post-op Pain Management:    Induction:   PONV Risk Score and Plan:   Airway Management Planned:   Additional Equipment:   Intra-op Plan:   Post-operative Plan:   Informed Consent: I have reviewed the patients History and Physical, chart, labs and discussed the procedure including the risks, benefits and alternatives for the proposed anesthesia with the patient or authorized representative who has indicated his/her understanding and acceptance.   Dental Advisory Given  Plan Discussed with: CRNA and Surgeon  Anesthesia Plan Comments: (Labs checked- platelets confirmed with RN in room. Fetal heart tracing, per RN, reported to be stable enough for sitting procedure. Discussed epidural, and patient consents to the procedure:  included risk of possible headache,backache, failed block, allergic reaction, and nerve injury. This patient was asked if she had any questions or concerns before the procedure started.)        Anesthesia Quick Evaluation  

## 2016-10-16 NOTE — Anesthesia Preprocedure Evaluation (Signed)
Anesthesia Evaluation  Patient identified by MRN, date of birth, ID band Patient awake    Reviewed: Allergy & Precautions, H&P , Patient's Chart, lab work & pertinent test results, reviewed documented beta blocker date and time   Airway Mallampati: II  TM Distance: >3 FB Neck ROM: full    Dental no notable dental hx.    Pulmonary Current Smoker,    Pulmonary exam normal breath sounds clear to auscultation       Cardiovascular  Rhythm:regular Rate:Normal     Neuro/Psych    GI/Hepatic   Endo/Other    Renal/GU      Musculoskeletal   Abdominal   Peds  Hematology   Anesthesia Other Findings   Reproductive/Obstetrics                             Anesthesia Physical  Anesthesia Plan  ASA: II  Anesthesia Plan: Epidural   Post-op Pain Management:    Induction:   PONV Risk Score and Plan:   Airway Management Planned:   Additional Equipment:   Intra-op Plan:   Post-operative Plan:   Informed Consent: I have reviewed the patients History and Physical, chart, labs and discussed the procedure including the risks, benefits and alternatives for the proposed anesthesia with the patient or authorized representative who has indicated his/her understanding and acceptance.   Dental Advisory Given  Plan Discussed with: CRNA and Surgeon  Anesthesia Plan Comments: (Labs checked- platelets confirmed with RN in room. Fetal heart tracing, per RN, reported to be stable enough for sitting procedure. Discussed epidural, and patient consents to the procedure:  included risk of possible headache,backache, failed block, allergic reaction, and nerve injury. This patient was asked if she had any questions or concerns before the procedure started.)        Anesthesia Quick Evaluation

## 2016-10-16 NOTE — Anesthesia Procedure Notes (Signed)
Epidural Patient location during procedure: OB  Staffing Anesthesiologist: Krystal Barajas, Krystal Barajas Performed: anesthesiologist   Preanesthetic Checklist Completed: patient identified, pre-op evaluation, timeout performed, IV checked, risks and benefits discussed and monitors and equipment checked  Epidural Patient position: sitting Prep: DuraPrep Patient monitoring: continuous pulse ox and blood pressure Approach: midline Location: L3-L4 Injection technique: LOR saline  Needle:  Needle type: Tuohy  Needle gauge: 17 G Needle length: 9 cm Needle insertion depth: 5 cm Catheter size: 19 Gauge Catheter at skin depth: 12 cm Test dose: negative and Other (1% lidocaine)  Additional Notes Patient identified. Risks including, but not limited to, bleeding, infection, nerve damage, paralysis, inadequate analgesia, blood pressure changes, nausea, vomiting, allergic reaction, postpartum back pain, itching, and headache were discussed. Patient expressed understanding and wished to proceed. Sterile prep and drape, including hand hygiene, mask, and sterile gloves were used. The patient was positioned and the spine was prepped. The skin was anesthetized with lidocaine. No paraesthesia or other complication noted. The patient did not experience any signs of intravascular injection such as tinnitus or metallic taste in mouth, nor signs of intrathecal spread such as rapid motor block. Please see nursing notes for vital signs. The patient tolerated the procedure well.   Krystal Barajas, MDReason for block:procedure for pain

## 2016-10-16 NOTE — Op Note (Signed)
Krystal BlanksKrystle Bessey 10/16/2016  PREOPERATIVE DIAGNOSES: Multiparity, undesired fertility  POSTOPERATIVE DIAGNOSES: Multiparity, undesired fertility  PROCEDURE:  Postpartum Bilateral Tubal Sterilization using Filshie Clips   SURGEONS: Dr.  Jaynie CollinsUgonna Anyanwu and Dr. Raynelle FanningJulie Degele  ANESTHESIA:  Epidural and local analgesia using 20 ml of 0.5% Marcaine  COMPLICATIONS:  None immediate.  ESTIMATED BLOOD LOSS: 20 ml.  INDICATIONS:  36 y.o. [id number] with undesired fertility,status post vaginal delivery, desires permanent sterilization.  Other reversible forms of contraception were discussed with patient; she declines all other modalities. Risks of procedure discussed with patient including but not limited to: risk of regret, permanence of method, bleeding, infection, injury to surrounding organs and need for additional procedures.  Failure risk of 1 -2 % with increased risk of ectopic gestation if pregnancy occurs was also discussed with patient.      FINDINGS:  Normal uterus, tubes, and ovaries.  PROCEDURE DETAILS: The patient was taken to the operating room where her epidural anesthesia was dosed up to surgical level and found to be adequate.  She was then placed in the dorsal supine position and prepped and draped in sterile fashion.  After an adequate timeout was performed, attention was turned to the patient's abdomen where a small transverse skin incision was made under the umbilical fold. The incision was taken down to the layer of fascia using the scalpel, and fascia was incised, and extended bilaterally using Mayo scissors. The peritoneum was entered in a sharp fashion. Attention was then turned to the patient's uterus, and left fallopian tube was identified and followed out to the fimbriated end.  A Filshie clip was placed on the left fallopian tube about 3 cm from the cornual attachment, with care given to incorporate the underlying mesosalpinx.  A similar process was carried out on the right side  allowing for bilateral tubal sterilization.  Good hemostasis was noted overall.  Local analgesia was injected into both Filshie application sites.The instruments were then removed from the patient's abdomen and the fascial incision was repaired with 0 Vicryl, and the skin was closed with a 4-0 Vicryl subcuticular stitch. The patient tolerated the procedure well.  Instrument, sponge, and needle counts were correct times two.  The patient was then taken to the recovery room awake and in stable condition.   Jaynie CollinsUGONNA  ANYANWU, MD, FACOG Attending Obstetrician & Gynecologist Faculty Practice, [facility] [place]

## 2016-10-17 ENCOUNTER — Encounter (HOSPITAL_COMMUNITY): Payer: Self-pay | Admitting: Obstetrics & Gynecology

## 2016-10-17 LAB — CBC
HEMATOCRIT: 25.2 % — AB (ref 36.0–46.0)
HEMOGLOBIN: 8.9 g/dL — AB (ref 12.0–15.0)
MCH: 32 pg (ref 26.0–34.0)
MCHC: 35.3 g/dL (ref 30.0–36.0)
MCV: 90.6 fL (ref 78.0–100.0)
Platelets: 270 10*3/uL (ref 150–400)
RBC: 2.78 MIL/uL — AB (ref 3.87–5.11)
RDW: 13.8 % (ref 11.5–15.5)
WBC: 25.9 10*3/uL — AB (ref 4.0–10.5)

## 2016-10-17 LAB — RPR: RPR: NONREACTIVE

## 2016-10-17 NOTE — Clinical Social Work Maternal (Signed)
CLINICAL SOCIAL WORK MATERNAL/CHILD NOTE  Patient Details  Name: Krystal Barajas MRN: [id number] Date of Birth: 05/01/1980  Date:  10/17/2016  Clinical Social Worker Initiating Note:  Edgard Debord, MSW, LCSW-A   Date/ Time Initiated:  10/17/16/1403              Child's Name:  Krystal Barajas    Legal Guardian:  Other (Comment) (Not established by court system; MOB and FOB (Krystal Barajas DOB 10/19/1980) parent together in same house hold)   Need for Interpreter:  None   Date of Referral:  10/16/16     Reason for Referral:  Current Substance Use/Substance Use During Pregnancy    Referral Source:  RN   Address:  [address] [place] [address]  Phone number:  [phone]   Household Members: Self, Significant Other, Minor Children (Krystal Barajas (DOB 04/05/01) , Krystal Barajas (DOB 12/16/2004), Krystal Barajas (DOB 01/01/04), Krystal Barajas (DOB 02/19/2005), FOB Krystal Barajas DOB 10/19/1980)   Natural Supports (not living in the home): Friends, Extended Family   Professional Supports:    Employment:Unemployed   Type of Work: MOB is unemployed; FOB works with [employer]    Education:  High school graduate   Financial Resources:Medicaid   Other Resources: WIC, Food Stamps    Cultural/Religious Considerations Which May Impact Care: None reported.   Strengths: Ability to meet basic needs , Compliance with medical plan , Home prepared for child    Risk Factors/Current Problems: Substance Use    Cognitive State: Able to Concentrate , Alert , Insightful , Goal Oriented    Mood/Affect: Comfortable , Calm , Happy , Interested    CSW Assessment:CSW completed assessment for consult regarding hx of THC use during pregnancy and hx of opiate use (none in pregnancy). Upon this writers arrival, MOB was resting in bed while baby was asleep in bassinet. MOB was easily aroused thus CSW explained role and reasoning for  visit. MOB was warm and welcoming. CSW inquired about MOB's hx of opiate abuse and current hx of THC use in pregnancy. MOB notes she battled with Opiate abuse for many years; however, was able to overcome it and has been clean for 5 years now. MOB was fourth coming regarding hx of THC use during pregnancy. MOB notes she uses THC recreationally; however, does not use around the kids. MOB notes she did not believe THC would harm baby while inside. This writer discussed the affects of substance use of baby's; however, informed MOB that baby is not currently withdrawing. MOB was relieved. CSW informed MOB of the hospitals policy and procedure regarding drug use and mandated reporting for positive UDS/CDS screens. CSW informed MOB that baby and she were both positive for THC upon collection of UDS. CSW explained to MOB that a report will be made to [place] Department of Social Services for positive screen. MOB understood but was concerned as to what would happen following. CSW explained to MOB that a worker would reach out to MOB for further follow-up if need after report is made. MOB verbalized understanding. At this time, CSW assessed MOB for further psychosocial needs. MOB notes there are no further needs at this time. MOB does not have a PCP selected for baby yet but plans to take him to a practice in [place], [place].   CSW made a report to [place] Department of Social Services worker Amanda Gillespie who noted she will staff report with her supervisor and CSW will be notified of decision   via mail letter. At this time, there are no barriers to d/c and CPS will follow-up if needed with MOB at home upon d/c from the hospital.   CSW Plan/Description: Child Protective Service Report , No Further Intervention Required/No Barriers to Discharge, Patient/Family Education , Other (Comment) (CSW made CPS report to [place] DSS worker Amanda Gillespie for (+) UDS THC. )   Brucha Ahlquist, MSW,  LCSW-A Clinical Social Worker  [facility]  Office: [phone]  

## 2016-10-17 NOTE — Anesthesia Postprocedure Evaluation (Signed)
Anesthesia Post Note  Patient: Krystal Barajas  Procedure(s) Performed: Procedure(s) (LRB): POST PARTUM TUBAL LIGATION (Bilateral)     Patient location during evaluation: Mother Baby Anesthesia Type: Epidural Level of consciousness: awake and alert and oriented Pain management: satisfactory to patient Vital Signs Assessment: post-procedure vital signs reviewed and stable Respiratory status: spontaneous breathing and nonlabored ventilation Cardiovascular status: stable Postop Assessment: no headache, no backache, no signs of nausea or vomiting, adequate PO intake and patient able to bend at knees (patient up walking) Anesthetic complications: no    Last Vitals:  Vitals:   10/17/16 0323 10/17/16 0608  BP: (!) 107/58 108/60  Pulse: 67 75  Resp: 20 18  Temp: 36.8 C 36.7 C    Last Pain:  Vitals:   10/17/16 0608  TempSrc: Oral  PainSc: 3    Pain Goal:                 Madison HickmanGREGORY,Duane Earnshaw

## 2016-10-17 NOTE — Lactation Note (Signed)
This note was copied from a baby's chart. Lactation Consultation Note Mom BF her 2nd child for 11 months who is now almost 34 yrs old. Denied difficulty BF. Mom plans to BF this baby. Mom has everted nipples soft pendulum breast. Mom easily hand expressed colostrum. Baby had to be given formula yesterday d/t mom having BTL. Mom stated baby is BF well. LC did adjust lips for wider flange. Educated on STS, I&O, cluster feeding, newborn feeding habits,supply and demand.  Answered questions mom had. Mom has WIC. Encouraged to call for assistance if needed.  Patient Name: Krystal Barajas NWGNF'AToday's Date: 10/17/2016 Reason for consult: Initial assessment   Maternal Data Has patient been taught Hand Expression?: Yes Does the patient have breastfeeding experience prior to this delivery?: Yes  Feeding Feeding Type: Breast Fed Length of feed: 10 min (still BF)  LATCH Score Latch: Repeated attempts needed to sustain latch, nipple held in mouth throughout feeding, stimulation needed to elicit sucking reflex.  Audible Swallowing: A few with stimulation  Type of Nipple: Everted at rest and after stimulation  Comfort (Breast/Nipple): Soft / non-tender  Hold (Positioning): Assistance needed to correctly position infant at breast and maintain latch.  LATCH Score: 7  Interventions Interventions: Breast feeding basics reviewed;Support pillows;Assisted with latch;Position options;Skin to skin;Expressed milk;Breast massage;Hand express;Breast compression;Adjust position  Lactation Tools Discussed/Used WIC Program: Yes   Consult Status Consult Status: Follow-up Date: 10/17/16 Follow-up type: In-patient    Krystal Barajas, Diamond NickelLAURA G 10/17/2016, 3:50 AM

## 2016-10-17 NOTE — Anesthesia Postprocedure Evaluation (Signed)
Anesthesia Post Note  Patient: Roslynn AmbleKrystle Darrow  Procedure(s) Performed: * No procedures listed *     Patient location during evaluation: Mother Baby Anesthesia Type: Epidural Level of consciousness: awake and alert and oriented Pain management: satisfactory to patient Vital Signs Assessment: post-procedure vital signs reviewed and stable Respiratory status: spontaneous breathing and nonlabored ventilation Cardiovascular status: stable Postop Assessment: no headache, no backache, no signs of nausea or vomiting, adequate PO intake and patient able to bend at knees (patient up walking) Anesthetic complications: no    Last Vitals:  Vitals:   10/17/16 0323 10/17/16 0608  BP: (!) 107/58 108/60  Pulse: 67 75  Resp: 20 18  Temp: 36.8 C 36.7 C    Last Pain:  Vitals:   10/17/16 0608  TempSrc: Oral  PainSc: 3    Pain Goal:                 Madison HickmanGREGORY,Baker Kogler

## 2016-10-17 NOTE — Progress Notes (Signed)
Post Partum Day 1  Subjective:  Krystal Barajas is a 36 y.o. [id number] 797w5d s/p SVD.  No acute events overnight.  Pt denies problems with ambulating, voiding or po intake.  She denies nausea or vomiting.  Pain is moderately controlled.  She has had flatus. She has not had bowel movement.  Lochia Moderate.  Plan for birth control is bilateral tubal ligation.  Method of Feeding: breast  Objective: BP 108/60 (BP Location: Right Arm)   Pulse 75   Temp 98.1 F (36.7 C) (Oral)   Resp 18   Ht 5\' 4"  (1.626 m)   Wt 151 lb (68.5 kg)   LMP 01/19/2016 (Exact Date)   SpO2 99%   Breastfeeding? Unknown   BMI 25.92 kg/m   Physical Exam:  General: alert, cooperative and no distress Lochia:normal flow Chest: normal WOB Heart: RRR no m/r/g Abdomen: +BS, soft, nontender, fundus firm at/below umbilicus Uterine Fundus: firm, DVT Evaluation: No evidence of DVT seen on physical exam. Extremities:  edema   Recent Labs  10/16/16 1247 10/17/16 0505  HGB 13.0 8.9*  HCT 37.8 25.2*    Assessment/Plan:  ASSESSMENT: Krystal Barajas is a 36 y.o. G3P3003 467w5d ppd #1 s/p NSVD doing well.    Plan for discharge tomorrow and Breastfeeding   LOS: 1 day   John GiovanniCorey P Theressa Piedra 10/17/2016, 8:54 AM

## 2016-10-18 DIAGNOSIS — Z9851 Tubal ligation status: Secondary | ICD-10-CM

## 2016-10-18 MED ORDER — IBUPROFEN 600 MG PO TABS: 600.0000 mg | ORAL_TABLET | Freq: Four times a day (QID) | ORAL | 0 refills | Status: AC

## 2016-10-18 MED ORDER — FERROUS SULFATE 325 (65 FE) MG PO TABS: 325.0000 mg | ORAL_TABLET | Freq: Two times a day (BID) | ORAL | 3 refills | Status: AC

## 2016-10-18 MED ORDER — DOCUSATE SODIUM 100 MG PO CAPS: 100.0000 mg | ORAL_CAPSULE | Freq: Every day | ORAL | 2 refills | Status: AC | PRN

## 2016-10-18 NOTE — Lactation Note (Addendum)
This note was copied from a baby's chart. Lactation Consultation Note Mom has been BF well. Slightly sore from baby BF so much. Mom has great everted nipples. Breast filling slightly from baby cluster feeding. In 44 hrs. Of life baby had 4 stools and 6 voids.  Mom encouraged to feed baby 8-12 times/24 hours and with feeding cues. Stressed importance of I&O and supply and demand. Discussed power pumping if needed. Managing and preventing engorgement. Encouraged to massage breast knots and assess breast frequently for changes.  Storing milk, and feeding habits of newborns. Mom can easily express transitional colostrum.  Mom has WIC and will f/u there. Hand pump given w/instructions and demonstration. Flange fit well.  Patient Name: Krystal Barajas BJYNW'GToday's Date: 10/18/2016 Reason for consult: Follow-up assessment   Maternal Data    Feeding    LATCH Score       Type of Nipple: Everted at rest and after stimulation  Comfort (Breast/Nipple): Filling, red/small blisters or bruises, mild/mod discomfort        Interventions Interventions: Breast feeding basics reviewed;Support pillows;Position options;Expressed milk;Breast massage;Hand express;Breast compression;Hand pump  Lactation Tools Discussed/Used Tools: Pump Breast pump type: Manual Pump Review: Setup, frequency, and cleaning;Milk Storage Initiated by:: Peri JeffersonL. Feliza Diven RN IBCLC Date initiated:: 10/18/16   Consult Status Consult Status: Complete Date: 10/18/16 Follow-up type: In-patient    Charyl DancerCARVER, Krystal Barajas 10/18/2016, 7:54 AM

## 2016-10-18 NOTE — Discharge Summary (Signed)
OB Discharge Summary     Patient Name: Krystal Barajas DOB: 03/05/1981 MRN: [id number]  Date of admission: 10/16/2016 Delivering MD: Frederik PearEGELE, Emileigh Kellett P   Date of discharge: 10/18/2016  Admitting diagnosis: LABOR Bilateral Tubal Ligation  Intrauterine pregnancy: 3958w5d     Secondary diagnosis:  Principal Problem:   Spontaneous vaginal delivery Active Problems:   Status post tubal ligation  Additional problems: PPH     Discharge diagnosis: Term Pregnancy Delivered, Anemia and PPH                                                                                                Post partum procedures:postpartum tubal ligation  Augmentation: none  Complications: Hemorrhage>104800mL  Hospital course:  Onset of Labor With Vaginal Delivery     36 y.o. yo G3P3003 at 4058w5d was admitted in Active Labor and SROM on 10/16/2016. Labor was uncomplicated, but patient had delivery/postpartum course complicated by PPH, with total EBL of 1,000 mg. She had decrease in hemoglobin, but  Remained hemostatic.  Delivery details: Membrane Rupture Time/Date: 5:00 AM ,10/16/2016   Intrapartum Procedures: Episiotomy: None [1]                                         Lacerations:  2nd degree [3];Labial [10]  Patient had a delivery of a Viable infant. 10/16/2016  Information for the patient's newborn:  Heide ScalesWilfong, Boy Sheketa [[id number]][[id number]]  Delivery Method: Vag-Spont   Pateint had an otherwise uncomplicated postpartum course.  She is ambulating, tolerating a regular diet, passing flatus, and urinating well. Patient is discharged home in stable condition on 10/18/16. She was prescribed ferrous sulfate postpartum.   Physical exam  Vitals:   10/17/16 1000 10/17/16 1400 10/17/16 1822 10/18/16 0540  BP: 114/74 116/67 (!) 121/51 103/60  Pulse: 79 71 71 71  Resp: 18 18 20 17   Temp: 98 F (36.7 C) 97.8 F (36.6 C) 98.3 F (36.8 C) 98.5 F (36.9 C)  TempSrc: Oral Axillary Oral Oral  SpO2:      Weight:      Height:        General: alert, cooperative and no distress Lochia: appropriate Uterine Fundus: firm Incision: BTL incision clean, dry, intact, with honeycomb dressing in place DVT Evaluation: No evidence of DVT seen on physical exam. No significant calf/ankle edema. Labs: Lab Results  Component Value Date   WBC 25.9 (H) 10/17/2016   HGB 8.9 (L) 10/17/2016   HCT 25.2 (L) 10/17/2016   MCV 90.6 10/17/2016   PLT 270 10/17/2016   No flowsheet data found.  Discharge instruction: per After Visit Summary and "Baby and Me Booklet".  After visit meds:  Allergies as of 10/18/2016   No Known Allergies     Medication List    STOP taking these medications   COMFORT FIT MATERNITY SUPP MED Misc   multivitamin-prenatal 27-0.8 MG Tabs tablet   valACYclovir 500 MG tablet Commonly known as:  VALTREX     TAKE these medications   docusate sodium  100 MG capsule Commonly known as:  COLACE Take 1 capsule (100 mg total) by mouth daily as needed.   ferrous sulfate 325 (65 FE) MG tablet Take 1 tablet (325 mg total) by mouth 2 (two) times daily with a meal.   ibuprofen 600 MG tablet Commonly known as:  ADVIL,MOTRIN Take 1 tablet (600 mg total) by mouth every 6 (six) hours.       Diet: routine diet  Activity: Advance as tolerated. Pelvic rest for 6 weeks.   Outpatient follow up:6 weeks  Postpartum contraception: Tubal Ligation  Newborn Data: Live born female  Birth Weight: 7 lb 9.5 oz (3445 g) APGAR: 9, 9  Baby Feeding: Breast Disposition:home with mother   10/18/2016 Frederik PearJulie P Catalaya Garr, MD

## 2016-10-18 NOTE — Discharge Instructions (Signed)
Postpartum Care After Vaginal Delivery °The period of time right after you deliver your newborn is called the postpartum period. °What kind of medical care will I receive? °· You may continue to receive fluids and medicines through an IV tube inserted into one of your veins. °· If an incision was made near your vagina (episiotomy) or if you had some vaginal tearing during delivery, cold compresses may be placed on your episiotomy or your tear. This helps to reduce pain and swelling. °· You may be given a squirt bottle to use when you go to the bathroom. You may use this until you are comfortable wiping as usual. To use the squirt bottle, follow these steps: °? Before you urinate, fill the squirt bottle with warm water. Do not use hot water. °? After you urinate, while you are sitting on the toilet, use the squirt bottle to rinse the area around your urethra and vaginal opening. This rinses away any urine and blood. °? You may do this instead of wiping. As you start healing, you may use the squirt bottle before wiping yourself. Make sure to wipe gently. °? Fill the squirt bottle with clean water every time you use the bathroom. °· You will be given sanitary pads to wear. °How can I expect to feel? °· You may not feel the need to urinate for several hours after delivery. °· You will have some soreness and pain in your abdomen and vagina. °· If you are breastfeeding, you may have uterine contractions every time you breastfeed for up to several weeks postpartum. Uterine contractions help your uterus return to its normal size. °· It is normal to have vaginal bleeding (lochia) after delivery. The amount and appearance of lochia is often similar to a menstrual period in the first week after delivery. It will gradually decrease over the next few weeks to a dry, yellow-brown discharge. For most women, lochia stops completely by 6-8 weeks after delivery. Vaginal bleeding can vary from woman to woman. °· Within the first few  days after delivery, you may have breast engorgement. This is when your breasts feel heavy, full, and uncomfortable. Your breasts may also throb and feel hard, tightly stretched, warm, and tender. After this occurs, you may have milk leaking from your breasts. Your health care provider can help you relieve discomfort due to breast engorgement. Breast engorgement should go away within a few days. °· You may feel more sad or worried than normal due to hormonal changes after delivery. These feelings should not last more than a few days. If these feelings do not go away after several days, speak with your health care provider. °How should I care for myself? °· Tell your health care provider if you have pain or discomfort. °· Drink enough water to keep your urine clear or pale yellow. °· Wash your hands thoroughly with soap and water for at least 20 seconds after changing your sanitary pads, after using the toilet, and before holding or feeding your baby. °· If you are not breastfeeding, avoid touching your breasts a lot. Doing this can make your breasts produce more milk. °· If you become weak or lightheaded, or you feel like you might faint, ask for help before: °? Getting out of bed. °? Showering. °· Change your sanitary pads frequently. Watch for any changes in your flow, such as a sudden increase in volume, a change in color, the passing of large blood clots. If you pass a blood clot from your vagina, save it   to show to your health care provider. Do not flush blood clots down the toilet without having your health care provider look at them. °· Make sure that all your vaccinations are up to date. This can help protect you and your baby from getting certain diseases. You may need to have immunizations done before you leave the hospital. °· If desired, talk with your health care provider about methods of family planning or birth control (contraception). °How can I start bonding with my baby? °Spending as much time as  possible with your baby is very important. During this time, you and your baby can get to know each other and develop a bond. Having your baby stay with you in your room (rooming in) can give you time to get to know your baby. Rooming in can also help you become comfortable caring for your baby. Breastfeeding can also help you bond with your baby. °How can I plan for returning home with my baby? °· Make sure that you have a car seat installed in your vehicle. °? Your car seat should be checked by a certified car seat installer to make sure that it is installed safely. °? Make sure that your baby fits into the car seat safely. °· Ask your health care provider any questions you have about caring for yourself or your baby. Make sure that you are able to contact your health care provider with any questions after leaving the hospital. °This information is not intended to replace advice given to you by your health care provider. Make sure you discuss any questions you have with your health care provider. °Document Released: 12/28/2006 Document Revised: 08/05/2015 Document Reviewed: 02/04/2015 °Elsevier Interactive Patient Education © 2018 [employer] ° °

## 2016-10-19 ENCOUNTER — Encounter (HOSPITAL_COMMUNITY): Payer: Self-pay

## 2016-10-20 ENCOUNTER — Encounter: Payer: Medicaid Other | Admitting: Certified Nurse Midwife

## 2016-10-27 ENCOUNTER — Encounter: Payer: Medicaid Other | Admitting: Certified Nurse Midwife

## 2016-11-12 NOTE — Progress Notes (Signed)
Post discharge chart review completed.  

## 2016-11-17 ENCOUNTER — Encounter: Payer: Self-pay | Admitting: Certified Nurse Midwife

## 2016-11-17 ENCOUNTER — Ambulatory Visit (INDEPENDENT_AMBULATORY_CARE_PROVIDER_SITE_OTHER): Payer: Medicaid Other | Admitting: Certified Nurse Midwife

## 2016-11-17 VITALS — BP 103/68 | HR 79 | Wt 128.0 lb

## 2016-11-17 DIAGNOSIS — D508 Other iron deficiency anemias: Secondary | ICD-10-CM

## 2016-11-17 DIAGNOSIS — G5600 Carpal tunnel syndrome, unspecified upper limb: Secondary | ICD-10-CM | POA: Insufficient documentation

## 2016-11-17 DIAGNOSIS — Z9851 Tubal ligation status: Secondary | ICD-10-CM

## 2016-11-17 DIAGNOSIS — G5603 Carpal tunnel syndrome, bilateral upper limbs: Secondary | ICD-10-CM

## 2016-11-17 NOTE — Progress Notes (Signed)
Post Partum Exam  Krystal AmbleKrystle Barajas is a 36 y.o. 83P3003 female who presents for a postpartum visit. She is 4 weeks postpartum following a spontaneous vaginal delivery. I have fully reviewed the prenatal and intrapartum course. The delivery was at 38.5 gestational weeks.  Anesthesia: epidural. Postpartum course has been doing well. Baby's course has been doing well. Baby is feeding by breast. Pt states she is planning to switch over to formula feeding. Bleeding no bleeding. Bowel function is normal. Bladder function is normal. Patient is not sexually active. Contraception method is tubal ligation.  Does report bilateral wrist pain; has a hx of carpel tunnel syndrome.  Stopped bleeding two weeks after delivery.   Postpartum depression screening:neg, score 1.  The following portions of the patient's history were reviewed and updated as appropriate: allergies, current medications, past family history, past medical history, past social history, past surgical history and problem list.  Review of Systems Pertinent items noted in HPI and remainder of comprehensive ROS otherwise negative.    Objective:  unknown if currently breastfeeding.  General:  alert, cooperative and no distress   Breasts:  inspection negative, no nipple discharge or bleeding, no masses or nodularity palpable  Lungs: clear to auscultation bilaterally  Heart:  regular rate and rhythm, S1, S2 normal, no murmur, click, rub or gallop  Abdomen: soft, non-tender; bowel sounds normal; no masses,  no organomegaly, lap sites healing  Pelvic Exam: Not performed.        Assessment:    Normal 4 week postpartum exam. Pap smear not done at today's visit.   Plan:   1. Contraception: tubal ligation 2. F/U in 4 weeks to evaluate wrist pain, HgB and perform annual exam.   3. Follow up in: 4 weeks or as needed.

## 2016-11-18 LAB — CBC
HEMATOCRIT: 40.8 % (ref 34.0–46.6)
HEMOGLOBIN: 13.4 g/dL (ref 11.1–15.9)
MCH: 30.1 pg (ref 26.6–33.0)
MCHC: 32.8 g/dL (ref 31.5–35.7)
MCV: 92 fL (ref 79–97)
Platelets: 324 10*3/uL (ref 150–379)
RBC: 4.45 x10E6/uL (ref 3.77–5.28)
RDW: 13.2 % (ref 12.3–15.4)
WBC: 9.7 10*3/uL (ref 3.4–10.8)

## 2016-12-15 ENCOUNTER — Ambulatory Visit: Payer: Medicaid Other | Admitting: Certified Nurse Midwife

## 2017-04-07 ENCOUNTER — Encounter: Payer: Self-pay | Admitting: Certified Nurse Midwife

## 2017-04-07 ENCOUNTER — Ambulatory Visit (INDEPENDENT_AMBULATORY_CARE_PROVIDER_SITE_OTHER): Payer: Medicaid Other | Admitting: Certified Nurse Midwife

## 2017-04-07 ENCOUNTER — Other Ambulatory Visit (HOSPITAL_COMMUNITY)
Admission: RE | Admit: 2017-04-07 | Discharge: 2017-04-07 | Disposition: A | Discharge status: Home / Self Care | Payer: Medicaid Other | Source: Ambulatory Visit | Attending: Certified Nurse Midwife | Admitting: Certified Nurse Midwife

## 2017-04-07 VITALS — Ht 64.0 in | Wt 130.2 lb

## 2017-04-07 DIAGNOSIS — N898 Other specified noninflammatory disorders of vagina: Secondary | ICD-10-CM | POA: Insufficient documentation

## 2017-04-07 DIAGNOSIS — F17200 Nicotine dependence, unspecified, uncomplicated: Secondary | ICD-10-CM | POA: Insufficient documentation

## 2017-04-07 DIAGNOSIS — Z3042 Encounter for surveillance of injectable contraceptive: Secondary | ICD-10-CM

## 2017-04-07 DIAGNOSIS — Z124 Encounter for screening for malignant neoplasm of cervix: Secondary | ICD-10-CM

## 2017-04-07 DIAGNOSIS — N926 Irregular menstruation, unspecified: Secondary | ICD-10-CM | POA: Diagnosis not present

## 2017-04-07 DIAGNOSIS — Z01411 Encounter for gynecological examination (general) (routine) with abnormal findings: Secondary | ICD-10-CM | POA: Diagnosis not present

## 2017-04-07 DIAGNOSIS — Z01419 Encounter for gynecological examination (general) (routine) without abnormal findings: Secondary | ICD-10-CM

## 2017-04-07 DIAGNOSIS — Z1151 Encounter for screening for human papillomavirus (HPV): Secondary | ICD-10-CM | POA: Insufficient documentation

## 2017-04-07 DIAGNOSIS — Z3202 Encounter for pregnancy test, result negative: Secondary | ICD-10-CM | POA: Diagnosis not present

## 2017-04-07 LAB — POCT URINE PREGNANCY: PREG TEST UR: NEGATIVE

## 2017-04-07 MED ORDER — PRENATE PIXIE 10-0.6-0.4-200 MG PO CAPS: 1.0000 | ORAL_CAPSULE | Freq: Every day | ORAL | 12 refills | Status: AC

## 2017-04-07 MED ORDER — MEDROXYPROGESTERONE ACETATE 150 MG/ML IM SUSP: 150.0000 mg | INTRAMUSCULAR | 4 refills | Status: AC

## 2017-04-07 NOTE — Progress Notes (Signed)
LMP: 01/18/17 Contraception:BTL STD Screening:Declines PCP:Dr.Greta O Buch per pt   Last NWG:NFAOZHYpap:unknown  Mammogram:N/A Colonoscopy:N/A CC: pt states she has been bleeding since she had her baby.pt will have a heavy period at times w/ clots. Then at other times spotting it light.  UPT: NEGATIVE

## 2017-04-07 NOTE — Progress Notes (Signed)
Subjective:        Krystal Barajas is a 37 y.o. female here for a routine exam.  Current complaints: she had her baby.pt will have a heavy period at times w/ clots. Then at other times spotting it light, reports severe cramping with red bleeding.  Other times brown spotting, comes and goes.  Might have a few days in between with no bleeding.       Personal health questionnaire:  Is patient Ashkenazi Jewish, have a family history of breast and/or ovarian cancer: no Is there a family history of uterine cancer diagnosed at age < 4650, gastrointestinal cancer, urinary tract cancer, family member who is a Personnel officerLynch syndrome-associated carrier: no Is the patient overweight and hypertensive, family history of diabetes, personal history of gestational diabetes, preeclampsia or PCOS: no Is patient over 2655, have PCOS,  family history of premature CHD under age 365, diabetes, smoke, have hypertension or peripheral artery disease:  no At any time, has a partner hit, kicked or otherwise hurt or frightened you?: no Over the past 2 weeks, have you felt down, depressed or hopeless?: no Over the past 2 weeks, have you felt little interest or pleasure in doing things?:no   Gynecologic History Patient's last menstrual period was 01/18/2017 (approximate). Contraception: tubal ligation Last Pap: unknown. Results were: normal according to the patient Last mammogram: n/a <40 years.   Obstetric History OB History  Gravida Para Term Preterm AB Living  3 3 3     3   SAB TAB Ectopic Multiple Live Births        0 3    # Outcome Date GA Lbr Len/2nd Weight Sex Delivery Anes PTL Lv  3 Term 10/16/16 3669w5d 05:30 / 00:33 7 lb 9.5 oz (3.445 kg) M Vag-Spont EPI  LIV  2 Term 12/16/04 3229w4d  8 lb 6 oz (3.799 kg) M Vag-Spont  N LIV  1 Term 04/05/01 3672w0d  8 lb 2 oz (3.685 kg) M Vag-Spont  N LIV      Past Medical History:  Diagnosis Date  . Genital herpes affecting pregnancy, antepartum     Past Surgical History:   Procedure Laterality Date  . TONSILLECTOMY    . TUBAL LIGATION Bilateral 10/16/2016   Procedure: POST PARTUM TUBAL LIGATION;  Surgeon: Tereso NewcomerAnyanwu, Ugonna A, MD;  Location: [facility];  Service: Gynecology;  Laterality: Bilateral;     Current Outpatient Medications:  .  ibuprofen (ADVIL,MOTRIN) 600 MG tablet, Take 1 tablet (600 mg total) by mouth every 6 (six) hours., Disp: 60 tablet, Rfl: 0 .  docusate sodium (COLACE) 100 MG capsule, Take 1 capsule (100 mg total) by mouth daily as needed. (Patient not taking: Reported on 11/17/2016), Disp: 30 capsule, Rfl: 2 .  ferrous sulfate 325 (65 FE) MG tablet, Take 1 tablet (325 mg total) by mouth 2 (two) times daily with a meal. (Patient not taking: Reported on 11/17/2016), Disp: 60 tablet, Rfl: 3 .  medroxyPROGESTERone (DEPO-PROVERA) 150 MG/ML injection, Inject 1 mL (150 mg total) into the muscle every 3 (three) months., Disp: 1 mL, Rfl: 4 .  Prenat-FeAsp-Meth-FA-DHA w/o A (PRENATE PIXIE) 10-0.6-0.4-200 MG CAPS, Take 1 tablet by mouth daily., Disp: 30 capsule, Rfl: 12 No Known Allergies  Social History   Tobacco Use  . Smoking status: Current Every Day Smoker  . Smokeless tobacco: Never Used  Substance Use Topics  . Alcohol use: No    History reviewed. No pertinent family history.    Review of Systems  Constitutional:  negative for fatigue and weight loss Respiratory: negative for cough and wheezing Cardiovascular: negative for chest pain, fatigue and palpitations Gastrointestinal: negative for abdominal pain and change in bowel habits Musculoskeletal:negative for myalgias Neurological: negative for gait problems and tremors Behavioral/Psych: negative for abusive relationship, depression Endocrine: negative for temperature intolerance    Genitourinary:+ for abnormal menstrual periods, negative for genital lesions, hot flashes, sexual problems and vaginal discharge Integument/breast: negative for breast lump, breast tenderness, nipple  discharge and skin lesion(s)    Objective:       Ht 5\' 4"  (1.626 m)   Wt 130 lb 3.2 oz (59.1 kg)   LMP 01/18/2017 (Approximate)   Breastfeeding? Yes   BMI 22.35 kg/m  General:   alert  Skin:   no rash or abnormalities  Lungs:   clear to auscultation bilaterally  Heart:   regular rate and rhythm, S1, S2 normal, no murmur, click, rub or gallop  Breasts:   normal without suspicious masses, skin or nipple changes or axillary nodes  Abdomen:  normal findings: no organomegaly, soft, non-tender and no hernia  Pelvis:  External genitalia: normal general appearance Urinary system: urethral meatus normal and bladder without fullness, nontender Vaginal: normal without tenderness, induration or masses Cervix: normal appearance Adnexa: normal bimanual exam Uterus: anteverted and non-tender, normal size   Lab Review Urine pregnancy test Labs reviewed yes Radiologic studies reviewed no  50% of 30 min visit spent on counseling and coordination of care.   Assessment & Plan    Healthy female exam.    1. Irregular periods    - POCT urine pregnancy - medroxyPROGESTERone (DEPO-PROVERA) 150 MG/ML injection; Inject 1 mL (150 mg total) into the muscle every 3 (three) months.  Dispense: 1 mL; Refill: 4  2. Screening for cervical cancer    - Cytology - PAP  3. Encounter for annual routine gynecological examination    - Cervicovaginal ancillary only  4. Well woman exam    - Cytology - PAP  5. Vaginal discharge    - Cervicovaginal ancillary only  6. Depo-Provera contraceptive status    - Prenat-FeAsp-Meth-FA-DHA w/o A (PRENATE PIXIE) 10-0.6-0.4-200 MG CAPS; Take 1 tablet by mouth daily.  Dispense: 30 capsule; Refill: 12   Education reviewed: calcium supplements, depression evaluation, low fat, low cholesterol diet, safe sex/STD prevention, self breast exams, skin cancer screening, smoking cessation and weight bearing exercise. Contraception: tubal ligation. Follow up in: 1 year.    Meds ordered this encounter  Medications  . medroxyPROGESTERone (DEPO-PROVERA) 150 MG/ML injection    Sig: Inject 1 mL (150 mg total) into the muscle every 3 (three) months.    Dispense:  1 mL    Refill:  4  . Prenat-FeAsp-Meth-FA-DHA w/o A (PRENATE PIXIE) 10-0.6-0.4-200 MG CAPS    Sig: Take 1 tablet by mouth daily.    Dispense:  30 capsule    Refill:  12   Orders Placed This Encounter  Procedures  . POCT urine pregnancy   Possible management options include: Mirena: declined.  Start Depo injections for AUB.   Follow up with Depo injection.

## 2017-04-08 LAB — CERVICOVAGINAL ANCILLARY ONLY
Bacterial vaginitis: NEGATIVE
CANDIDA VAGINITIS: NEGATIVE
CHLAMYDIA, DNA PROBE: NEGATIVE
Neisseria Gonorrhea: NEGATIVE
Trichomonas: NEGATIVE

## 2017-04-09 LAB — CYTOLOGY - PAP
DIAGNOSIS: NEGATIVE
HPV: NOT DETECTED

## 2017-04-12 ENCOUNTER — Other Ambulatory Visit: Payer: Self-pay | Admitting: Certified Nurse Midwife

## 2017-04-13 ENCOUNTER — Ambulatory Visit: Payer: Medicaid Other

## 2017-04-14 ENCOUNTER — Encounter: Payer: Self-pay | Admitting: *Deleted

## 2017-04-14 ENCOUNTER — Telehealth: Payer: Self-pay | Admitting: *Deleted

## 2017-04-14 NOTE — Telephone Encounter (Signed)
Attempted to call pt to reschedule DEPO shot for AUB, no answer and unable to leave a voicemail, sent letter.

## 2017-05-23 IMAGING — US US MFM OB COMP +14 WKS
1 series · 14 of 28 positions shown · non-contrast
Comparison: none

[Series 1: us mfm ob comp +14 wks · 60 acquisitions, 14 frames shown]
[im 3/60]
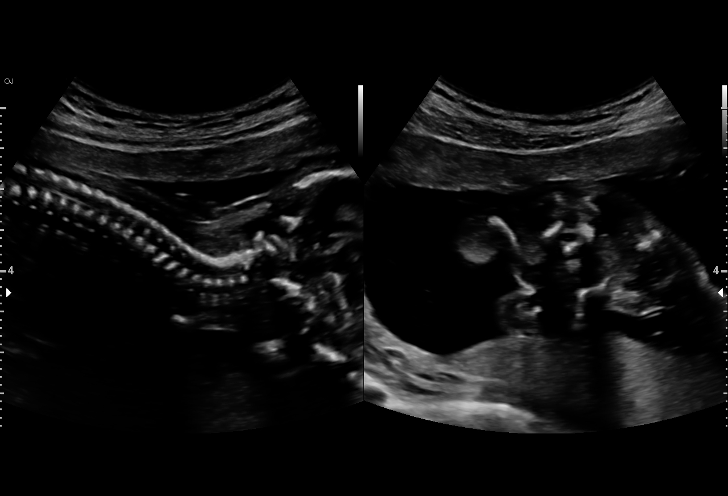
[im 7/60]
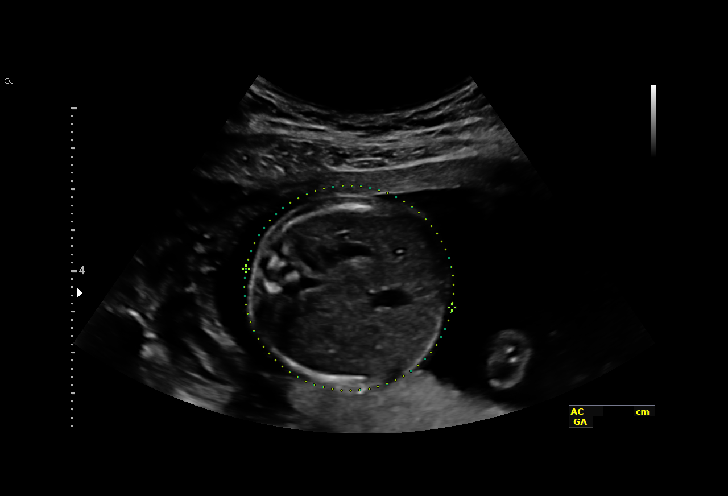
[im 11/60]
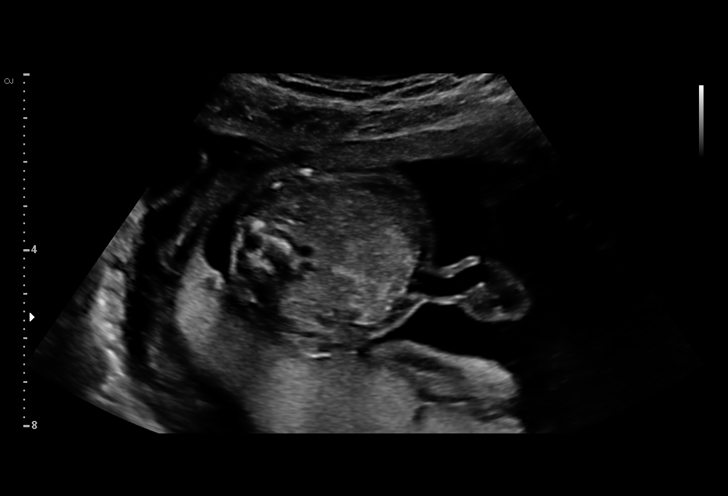
[im 16/60]
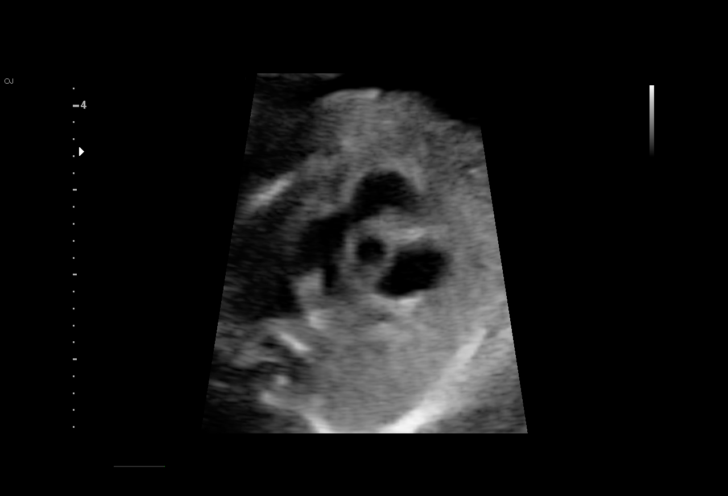
[im 20/60]
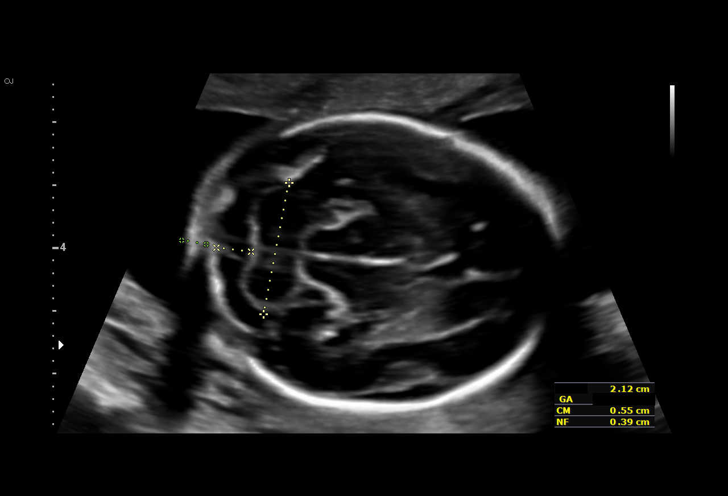
[im 25/60]
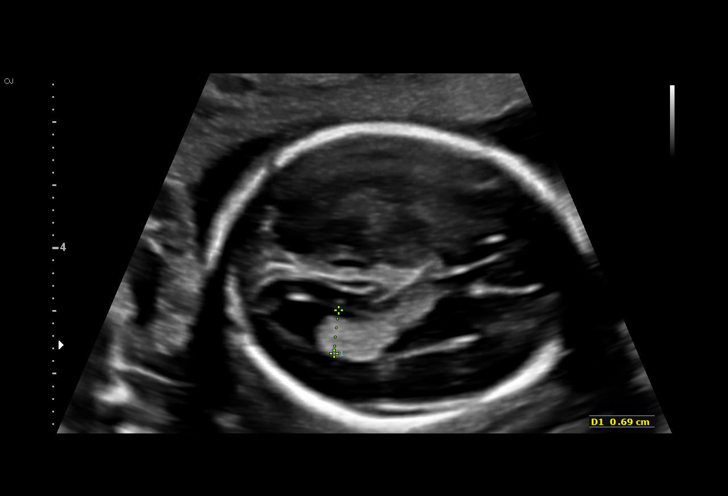
[im 29/60]
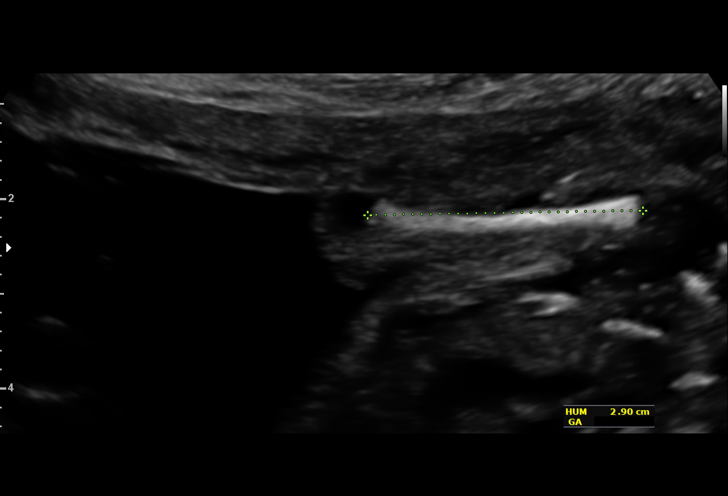
[im 33/60]
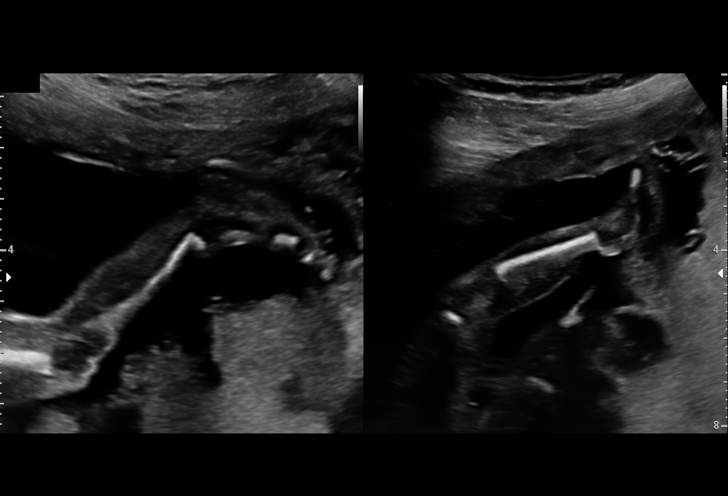
[im 38/60]
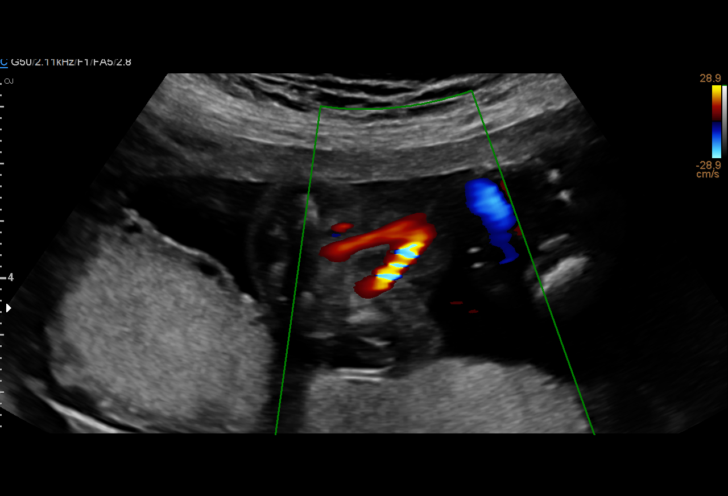
[im 42/60]
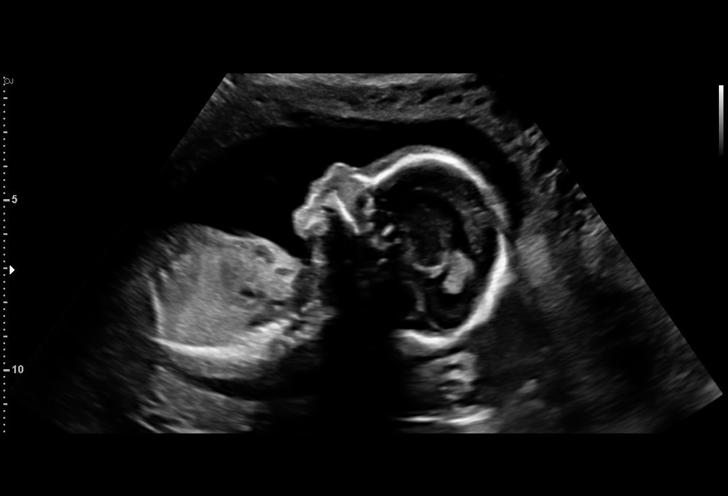
[im 46/60]
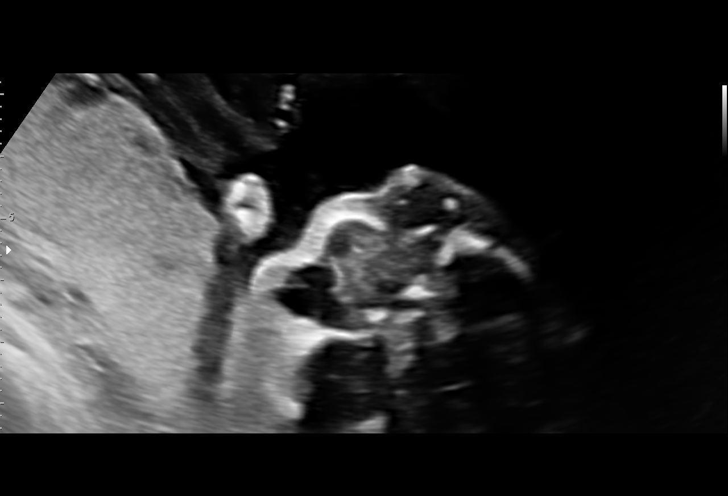
[im 51/60]
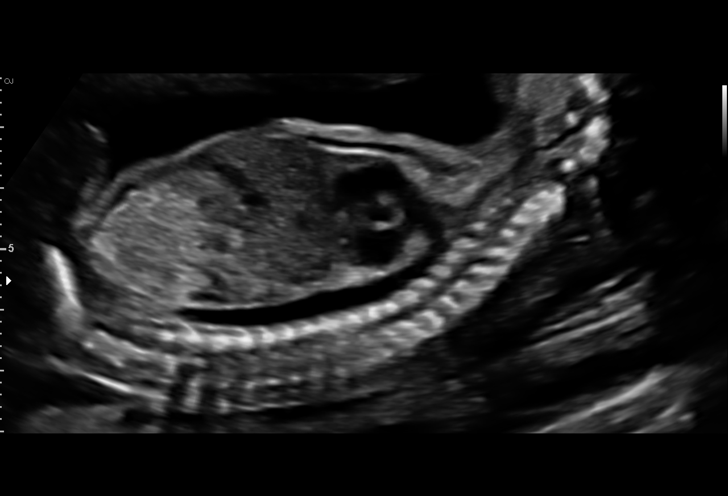
[im 55/60]
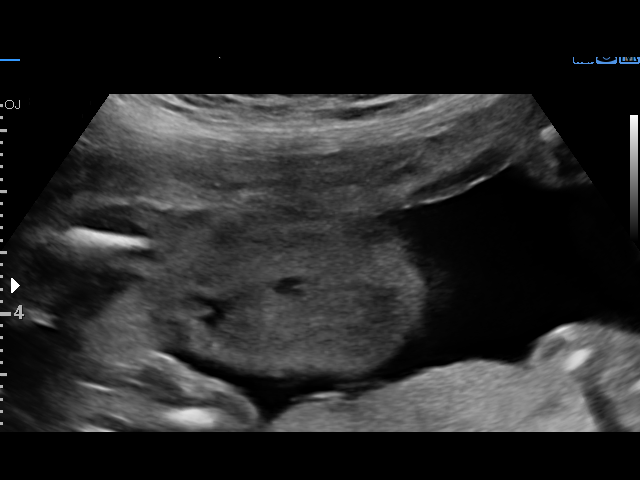
[im 60/60]
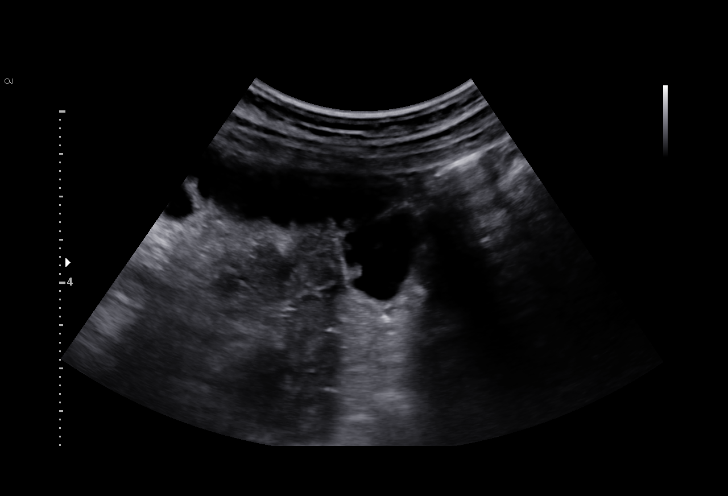

[14 of 28 positions shown; findings below may reference images not displayed]

1  QUIRIJN AMAZIGH           [id number]      [id number]     [id number]
Indications

19 weeks gestation of pregnancy
Encounter for antenatal screening for
malformations
OB History

Blood Type:            Height:  5'4"   Weight (lb):  130       BMI:
Gravidity:    3         Term:   2        Prem:   0        SAB:   0
TOP:          0       Ectopic:  0        Living: 2
Fetal Evaluation

Num Of Fetuses:     1
Cardiac Activity:   Observed
Presentation:       Cephalic
Placenta:           Posterior, above cervical os
P. Cord Insertion:  Visualized

Amniotic Fluid
AFI FV:      Subjectively within normal limits

Largest Pocket(cm)
3.49
Biometry

BPD:      45.8  mm     G. Age:  19w 6d         62  %    CI:        74.67   %    70 - 86
FL/HC:      18.1   %    16.8 -
HC:      168.2  mm     G. Age:  19w 4d         38  %    HC/AC:      1.06        1.09 -
AC:       159   mm     G. Age:  21w 0d         86  %    FL/BPD:     66.4   %
FL:       30.4  mm     G. Age:  19w 3d         37  %    FL/AC:      19.1   %    20 - 24
HUM:      27.8  mm     G. Age:  18w 6d         35  %
CER:      21.2  mm     G. Age:  20w 1d         61  %
NFT:       3.9  mm
CM:        5.5  mm

Est. FW:     337  gm    0 lb 12 oz      56  %
Gestational Age

LMP:           19w 4d        Date:  01/19/16                 EDD:   10/25/16
U/S Today:     20w 0d                                        EDD:   10/22/16
Best:          19w 4d     Det. By:  LMP  (01/19/16)          EDD:   10/25/16
Anatomy

Cranium:               Appears normal         Aortic Arch:            Appears normal
Cavum:                 Appears normal         Ductal Arch:            Appears normal
Ventricles:            Appears normal         Diaphragm:              Appears normal
Choroid Plexus:        Appears normal         Stomach:                Appears normal, left
sided
Cerebellum:            Appears normal         Abdomen:                Appears normal
Posterior Fossa:       Appears normal         Abdominal Wall:         Appears nml (cord
insert, abd wall)
Nuchal Fold:           Appears normal         Cord Vessels:           Appears normal (3
vessel cord)
Face:                  Appears normal         Kidneys:                Appear normal
(orbits and profile)
Lips:                  Appears normal         Bladder:                Appears normal
Thoracic:              Appears normal         Spine:                  Appears normal
Heart:                 Appears normal         Upper Extremities:      Appears normal
(4CH, axis, and situs
RVOT:                  Appears normal         Lower Extremities:      Appears normal
LVOT:                  Appears normal

Other:  Male gender. Heels and left 5th digit visualized.
Cervix Uterus Adnexa

Cervix
Length:            3.6  cm.
Normal appearance by transabdominal scan.

Uterus
No abnormality visualized.

Left Ovary
Not visualized. No adnexal mass visualized.

Right Ovary
Within normal limits.
Impression

Single living intrauterine pregnancy at 62w5d.
Posterior placenta without evidence of previa.
Appropriate fetal growth (56%).
Normal amniotic fluid volume.
The fetal anatomic survey is complete.
Normal fetal anatomy.
No fetal anomalies or soft markers of aneuploidy seen.
Recommendations
Follow-up ultrasounds as clinically indicated.
